# Patient Record
Sex: Male | Born: 1990 | Race: White | Hispanic: No | Marital: Single
Health system: Midwestern US, Community
[De-identification: ages and names within clinical notes are randomized; demographics above are authoritative.]

---

## 2011-06-27 ENCOUNTER — Emergency Department (INDEPENDENT_AMBULATORY_CARE_PROVIDER_SITE_OTHER)
Admission: EM | Admit: 2011-06-27 | Discharge: 2011-06-27 | Disposition: A | Discharge status: Home / Self Care | Payer: Self-pay | Source: Home / Self Care | Attending: Family Medicine | Admitting: Family Medicine

## 2011-06-27 ENCOUNTER — Encounter: Payer: Self-pay | Admitting: Emergency Medicine

## 2011-06-27 DIAGNOSIS — R05 Cough: Secondary | ICD-10-CM

## 2011-06-27 DIAGNOSIS — J40 Bronchitis, not specified as acute or chronic: Secondary | ICD-10-CM

## 2011-06-27 DIAGNOSIS — R059 Cough, unspecified: Secondary | ICD-10-CM

## 2011-06-27 MED ORDER — GUAIFENESIN-CODEINE 100-10 MG/5ML PO SYRP: 5.0000 mL | ORAL_SOLUTION | Freq: Four times a day (QID) | ORAL | Status: AC | PRN

## 2011-06-27 MED ORDER — ALBUTEROL SULFATE HFA 108 (90 BASE) MCG/ACT IN AERS
INHALATION_SPRAY | RESPIRATORY_TRACT | Status: AC
  Filled 2011-06-27: qty 6.7

## 2011-06-27 MED ORDER — AZITHROMYCIN 250 MG PO TABS: 250.0000 mg | ORAL_TABLET | Freq: Every day | ORAL | Status: AC

## 2011-06-27 MED ORDER — ALBUTEROL SULFATE HFA 108 (90 BASE) MCG/ACT IN AERS
2.0000 | INHALATION_SPRAY | RESPIRATORY_TRACT | Status: DC | PRN
  Administered 2011-06-27: 2 via RESPIRATORY_TRACT

## 2011-06-27 NOTE — ED Provider Notes (Signed)
History     CSN: [id number] Arrival date & time: 06/27/2011 10:58 AM   First MD Initiated Contact with Patient 06/27/11 1040      Chief Complaint  Patient presents with  . Cough  . Bronchitis    (Consider location/radiation/quality/duration/timing/severity/associated sxs/prior treatment) HPI Comments: Leonard Snow presents for evaluation of 3 weeks of persistent non-productive cough. He denies any fever. He does have asthma but has not been wheezing though he is out of his inhaler as he just moved, started a new job, and does not have insurance.   Patient is a 20 y.o. male presenting with cough. The history is provided by the patient.  Cough This is a new problem. The current episode started more than 1 week ago. The cough is non-productive. There has been no fever. Associated symptoms include myalgias. Pertinent negatives include no chills, no rhinorrhea, no sore throat, no shortness of breath and no wheezing. He has tried nothing for the symptoms. He is not a smoker.    Past Medical History  Diagnosis Date  . Asthma     History reviewed. No pertinent past surgical history.  Family History  Problem Relation Age of Onset  . Asthma Father     History  Substance Use Topics  . Smoking status: Never Smoker   . Smokeless tobacco: Not on file  . Alcohol Use: Yes      Review of Systems  Constitutional: Negative for fever and chills.  HENT: Negative for congestion, sore throat and rhinorrhea.   Eyes: Negative.   Respiratory: Positive for cough. Negative for shortness of breath and wheezing.   Gastrointestinal: Negative.   Genitourinary: Negative.   Musculoskeletal: Positive for myalgias.  Skin: Negative.   Neurological: Negative.     Allergies  Review of patient's allergies indicates no known allergies.  Home Medications   Current Outpatient Rx  Name Route Sig Dispense Refill  . AZITHROMYCIN 250 MG PO TABS Oral Take 1 tablet (250 mg total) by mouth daily. Take two  tablets on first day, then one tablet each day for four days 6 tablet 0  . GUAIFENESIN-CODEINE 100-10 MG/5ML PO SYRP Oral Take 5 mLs by mouth every 6 (six) hours as needed for cough or congestion. 120 mL 0    BP 121/70  Pulse 70  Temp(Src) 98.1 F (36.7 C) (Oral)  Resp 16  SpO2 99%  Physical Exam  Constitutional: He appears well-developed and well-nourished.  HENT:  Head: Normocephalic and atraumatic.  Right Ear: Tympanic membrane and external ear normal.  Left Ear: Tympanic membrane and external ear normal.  Mouth/Throat: Uvula is midline, oropharynx is clear and moist and mucous membranes are normal. No oropharyngeal exudate, posterior oropharyngeal edema or posterior oropharyngeal erythema.  Eyes: Conjunctivae and EOM are normal. Pupils are equal, round, and reactive to light.  Cardiovascular: Normal rate and regular rhythm.   Pulmonary/Chest: Effort normal and breath sounds normal. He has no wheezes. He has no rhonchi. He has no rales.  Skin: Skin is warm and dry.    ED Course  Procedures (including critical care time)  Labs Reviewed - No data to display No results found.   1. Cough   2. Bronchitis       MDM  Rx given: guaifenesin AC and Z-pack, with albuterol inhaler.        Richardo Priest, MD 06/27/11 [phone]

## 2011-06-27 NOTE — ED Notes (Signed)
Pt here with coughing spells that has worsened with yellow/blood tinged mucous,chest congestion and feeling like throat closing in on him at times.pt has hx asthma but not used inhaler.no wheezing or fevers reported.sats 100% r/a

## 2011-12-12 ENCOUNTER — Encounter (HOSPITAL_COMMUNITY): Payer: Self-pay

## 2011-12-12 ENCOUNTER — Emergency Department (HOSPITAL_COMMUNITY)
Admission: EM | Admit: 2011-12-12 | Discharge: 2011-12-12 | Disposition: A | Discharge status: Home / Self Care | Payer: Self-pay | Attending: Emergency Medicine | Admitting: Emergency Medicine

## 2011-12-12 DIAGNOSIS — J45909 Unspecified asthma, uncomplicated: Secondary | ICD-10-CM | POA: Insufficient documentation

## 2011-12-12 DIAGNOSIS — W57XXXA Bitten or stung by nonvenomous insect and other nonvenomous arthropods, initial encounter: Secondary | ICD-10-CM

## 2011-12-12 DIAGNOSIS — S30860A Insect bite (nonvenomous) of lower back and pelvis, initial encounter: Secondary | ICD-10-CM | POA: Insufficient documentation

## 2011-12-12 NOTE — ED Notes (Signed)
Pt. Believes he was bitten by a spider on his lumbar area.  There are 2 spots on his lower back. They are size of a pin head and are red with dk centers Denies any pain , are itchy, no swelling noted.   Pt. Did kill a brown recluse in his closet

## 2011-12-12 NOTE — Discharge Instructions (Signed)
Apply hydrocortisone cream twice a day. Take Claritin once a day as needed for itching. Return if the spots are getting significantly bigger , if he starts running pus.a fever, or if they start draining pus.

## 2011-12-12 NOTE — ED Provider Notes (Signed)
History  This chart was scribed for Dione Booze, MD by Bennett Scrape. This patient was seen in room STRE1/STRE1 and the patient's care was started at 11:25AM.  CSN: [id number]  Arrival date & time 12/12/11  1037   First MD Initiated Contact with Patient 12/12/11 1125      Chief Complaint  Patient presents with  . Insect Bite    The history is provided by the patient. No language interpreter was used.    Leonard Snow is a 21 y.o. male with a h/o asthma who presents to the Emergency Department complaining of 3 weeks ago of sudden onset, gradually worsening, constant two itchy insect bites in his lumbar region. He states that they started off as a small red spot that has become the size of a pea. He believes that it was a brown recluse spider that bite him, because he killed one in his closet. He denies drainage from the areas. He denies fever and chills as associated symptoms. He is an alcohol user but denies smoking.   Past Medical History  Diagnosis Date  . Asthma     History reviewed. No pertinent past surgical history.  Family History  Problem Relation Age of Onset  . Asthma Father     History  Substance Use Topics  . Smoking status: Never Smoker   . Smokeless tobacco: Not on file  . Alcohol Use: Yes      Review of Systems  Constitutional: Negative for fever and chills.  Respiratory: Negative for shortness of breath.   Gastrointestinal: Negative for nausea and vomiting.  Skin: Positive for wound (2 insect bites on lumbar region).  Neurological: Negative for weakness.    Allergies  Review of patient's allergies indicates no known allergies.  Home Medications   Current Outpatient Rx  Name Route Sig Dispense Refill  . ALBUTEROL SULFATE HFA 108 (90 BASE) MCG/ACT IN AERS Inhalation Inhale 2 puffs into the lungs every 6 (six) hours as needed. For shortness of breath      Triage Vitals: BP 134/76  Pulse 67  Temp(Src) 97.9 F (36.6 C) (Oral)  Resp 18  SpO2  96%  Physical Exam  Nursing note and vitals reviewed. Constitutional: He is oriented to person, place, and time. He appears well-developed and well-nourished. No distress.  HENT:  Head: Normocephalic and atraumatic.  Eyes: Conjunctivae and EOM are normal.  Neck: Neck supple. No tracheal deviation present.  Cardiovascular: Normal rate.   Pulmonary/Chest: Effort normal. No respiratory distress.  Abdominal: He exhibits no distension.  Musculoskeletal: Normal range of motion.  Neurological: He is alert and oriented to person, place, and time.  Skin: Skin is warm and dry.       Two indurated areas on the lower back approximately 3 mm in size, no erythema, no fluctuance, no warmth  Psychiatric: He has a normal mood and affect. His behavior is normal.    ED Course  Procedures (including critical care time)  DIAGNOSTIC STUDIES: Oxygen Saturation is 96% on room air, adequate by my interpretation.    COORDINATION OF CARE: 11:31AM-Discussed discharge plan of using OTC hydrocortisone cream or claritin to reduce the itching with pt and pt agreed.     1. Insect bite       MDM  There are 2 areas which appear to be arthropod bites. There is no evidence of necrosis and no evidence of abscess formation. He is reassured that there is no need for further treatment at this point.  I personally performed the services described in this documentation, which was scribed in my presence. The recorded information has been reviewed and considered.      Dione Booze, MD 12/15/11 [phone]

## 2012-11-02 ENCOUNTER — Emergency Department (HOSPITAL_COMMUNITY): Payer: Self-pay

## 2012-11-02 ENCOUNTER — Telehealth (HOSPITAL_COMMUNITY): Payer: Self-pay | Admitting: Emergency Medicine

## 2012-11-02 ENCOUNTER — Emergency Department (HOSPITAL_COMMUNITY)
Admission: EM | Admit: 2012-11-02 | Discharge: 2012-11-02 | Disposition: A | Discharge status: Home / Self Care | Payer: Self-pay | Attending: Emergency Medicine | Admitting: Emergency Medicine

## 2012-11-02 ENCOUNTER — Encounter (HOSPITAL_COMMUNITY): Payer: Self-pay | Admitting: Emergency Medicine

## 2012-11-02 DIAGNOSIS — W268XXA Contact with other sharp object(s), not elsewhere classified, initial encounter: Secondary | ICD-10-CM | POA: Insufficient documentation

## 2012-11-02 DIAGNOSIS — S91331A Puncture wound without foreign body, right foot, initial encounter: Secondary | ICD-10-CM

## 2012-11-02 DIAGNOSIS — M795 Residual foreign body in soft tissue: Secondary | ICD-10-CM

## 2012-11-02 DIAGNOSIS — Y9289 Other specified places as the place of occurrence of the external cause: Secondary | ICD-10-CM | POA: Insufficient documentation

## 2012-11-02 DIAGNOSIS — Y9302 Activity, running: Secondary | ICD-10-CM | POA: Insufficient documentation

## 2012-11-02 DIAGNOSIS — Z23 Encounter for immunization: Secondary | ICD-10-CM | POA: Insufficient documentation

## 2012-11-02 DIAGNOSIS — S91109A Unspecified open wound of unspecified toe(s) without damage to nail, initial encounter: Secondary | ICD-10-CM | POA: Insufficient documentation

## 2012-11-02 DIAGNOSIS — J45909 Unspecified asthma, uncomplicated: Secondary | ICD-10-CM | POA: Insufficient documentation

## 2012-11-02 DIAGNOSIS — Z79899 Other long term (current) drug therapy: Secondary | ICD-10-CM | POA: Insufficient documentation

## 2012-11-02 MED ORDER — HYDROCODONE-ACETAMINOPHEN 5-325 MG PO TABS: 1.0000 | ORAL_TABLET | Freq: Four times a day (QID) | ORAL | Status: DC | PRN

## 2012-11-02 MED ORDER — ONDANSETRON HCL 4 MG/2ML IJ SOLN
4.0000 mg | Freq: Once | INTRAMUSCULAR | Status: AC
  Administered 2012-11-02: 4 mg via INTRAVENOUS
  Filled 2012-11-02: qty 2

## 2012-11-02 MED ORDER — HYDROMORPHONE HCL PF 1 MG/ML IJ SOLN
1.0000 mg | Freq: Once | INTRAMUSCULAR | Status: AC
  Administered 2012-11-02: 1 mg via INTRAVENOUS
  Filled 2012-11-02: qty 1

## 2012-11-02 MED ORDER — TETANUS-DIPHTH-ACELL PERTUSSIS 5-2.5-18.5 LF-MCG/0.5 IM SUSP
0.5000 mL | Freq: Once | INTRAMUSCULAR | Status: AC
  Administered 2012-11-02: 0.5 mL via INTRAMUSCULAR
  Filled 2012-11-02: qty 0.5

## 2012-11-02 MED ORDER — CIPROFLOXACIN HCL 500 MG PO TABS: 500.0000 mg | ORAL_TABLET | Freq: Two times a day (BID) | ORAL | Status: DC

## 2012-11-02 NOTE — ED Notes (Signed)
BIB [facility]. Patient was jogging by scrap pile and contacted piece of lumber with nail attached. Last tetanus vaccine 06/2009. Fentanyl given by EMS. 18g Left forearm.

## 2012-11-02 NOTE — ED Provider Notes (Signed)
History     CSN: [id number]  Arrival date & time 11/02/12  1713   First MD Initiated Contact with Patient 11/02/12 1727      Chief Complaint  Patient presents with  . Foot Injury    (Consider location/radiation/quality/duration/timing/severity/associated sxs/prior treatment) Patient is a 22 y.o. male presenting with foot injury. The history is provided by the patient.  Foot Injury Location:  Toe Time since incident:  1 hour Injury: yes   Mechanism of injury comment:  Nail stuck in the right great toe Toe location:  R big toe Pain details:    Quality:  Sharp and throbbing   Radiates to:  Does not radiate   Severity:  Severe   Onset quality:  Sudden   Duration:  2 hours   Timing:  Constant   Progression:  Unchanged Chronicity:  New Tetanus status:  Up to date Prior injury to area:  No Relieved by:  Nothing Worsened by:  Nothing tried Ineffective treatments:  None tried Associated symptoms: no fever, no numbness and no tingling     Past Medical History  Diagnosis Date  . Asthma     History reviewed. No pertinent past surgical history.  Family History  Problem Relation Age of Onset  . Asthma Father     History  Substance Use Topics  . Smoking status: Never Smoker   . Smokeless tobacco: Not on file  . Alcohol Use: Yes      Review of Systems  Constitutional: Negative for fever.  Neurological: Negative for weakness and numbness.  All other systems reviewed and are negative.    Allergies  Review of patient's allergies indicates no known allergies.  Home Medications   Current Outpatient Rx  Name  Route  Sig  Dispense  Refill  . albuterol (PROVENTIL HFA;VENTOLIN HFA) 108 (90 BASE) MCG/ACT inhaler   Inhalation   Inhale 2 puffs into the lungs every 6 (six) hours as needed. For shortness of breath           BP 139/94  Pulse 88  Temp(Src) 98.5 F (36.9 C) (Oral)  Resp 18  SpO2 97%  Physical Exam  Nursing note and vitals  reviewed. Constitutional: He is oriented to person, place, and time. He appears well-developed and well-nourished. He appears distressed.  HENT:  Head: Normocephalic and atraumatic.  Eyes: EOM are normal. Pupils are equal, round, and reactive to light.  Cardiovascular: Normal rate.   Pulmonary/Chest: Effort normal.  Musculoskeletal: He exhibits tenderness.       Right foot: He exhibits normal capillary refill and no deformity.       Feet:  Neurological: He is alert and oriented to person, place, and time. He has normal strength. No sensory deficit.  Skin: Skin is warm and dry.  Psychiatric: He has a normal mood and affect. His behavior is normal. Thought content normal.    ED Course  Procedures (including critical care time)  Labs Reviewed - No data to display Dg Foot Complete Right  11/02/2012  *RADIOLOGY REPORT*  Clinical Data: Nail puncture wound of the great toe.  Remote prior ankle fracture.  RIGHT FOOT COMPLETE - 3+ VIEW  Comparison: None.  Findings: Linear calcific density noted along the lateral margin of the proximal metaphysis of the distal phalanx of the great toe, probably incidental.  No metal foreign body or fracture noted.  Transversely oriented corticated ossicle from the base of the fifth metatarsal, suspicious for nonunion of a avulsion fracture in the past, correlate with  patient history.  No malalignment at the Lisfranc joint observed.  IMPRESSION:  1.  No retained metal foreign body.  Faint linear calcification along the lateral proximal metaphysis of the distal phalanx of the great toe is probably incidental. 2.  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   Original Report Authenticated By: Gaylyn Rong, M.D.      No diagnosis found.    MDM   Patient with nail impaled in his right great toe.    Patient is complaining of pain in his right great toe but is neurovascularly intact. I did digital block however patient states did not  help much with the pain. Due to severe pain and radiology taking a long time to get an x-ray the nail was removed by pulling it out. Tetanus shot is up to date.   X-ray pending.  Will start on cipro for pseudomonas coverage due to going through tennis shoe.  7:12 PM X-ray without acute findings.  Pt states now that he does not think his tetanus is UTD so will update irrigate wound and d/c home.      Gwyneth Sprout, MD 11/02/12 [phone]

## 2012-11-02 NOTE — ED Notes (Signed)
Last rescue inhaler use >1 month

## 2012-11-19 ENCOUNTER — Emergency Department (INDEPENDENT_AMBULATORY_CARE_PROVIDER_SITE_OTHER)
Admission: EM | Admit: 2012-11-19 | Discharge: 2012-11-19 | Disposition: A | Discharge status: Home / Self Care | Payer: Self-pay | Source: Home / Self Care | Attending: Emergency Medicine | Admitting: Emergency Medicine

## 2012-11-19 ENCOUNTER — Encounter (HOSPITAL_COMMUNITY): Payer: Self-pay | Admitting: *Deleted

## 2012-11-19 DIAGNOSIS — K0520 Aggressive periodontitis, unspecified: Secondary | ICD-10-CM

## 2012-11-19 MED ORDER — PENICILLIN V POTASSIUM 500 MG PO TABS: 500.0000 mg | ORAL_TABLET | Freq: Three times a day (TID) | ORAL | Status: DC

## 2012-11-19 MED ORDER — HYDROCODONE-ACETAMINOPHEN 5-325 MG PO TABS: ORAL_TABLET | ORAL | Status: DC

## 2012-11-19 MED ORDER — NAPROXEN 500 MG PO TABS: 500.0000 mg | ORAL_TABLET | Freq: Two times a day (BID) | ORAL | Status: DC

## 2012-11-19 MED ORDER — METRONIDAZOLE 500 MG PO TABS: 500.0000 mg | ORAL_TABLET | Freq: Three times a day (TID) | ORAL | Status: DC

## 2012-11-19 NOTE — ED Provider Notes (Signed)
Chief Complaint:   Chief Complaint  Patient presents with  . Dental Pain    History of Present Illness:   Leonard Snow is a 22 year old male who has had a two-week history of pain in his bottom left wisdom tooth area which is erupting. The top left wisdom tooth came through about a month ago and cause similar pain but now is subsiding he's had a funny taste in his mouth and some swelling of the gingiva and the jaw externally. He denies any difficulty breathing or swallowing. It hurts to chew on that side. He denies fever, chills, headache, swelling of his neck, chest pain, or shortness of breath. This causes his entire lower left dentition to hurt.  Review of Systems:  Other than noted above, the patient denies any of the following symptoms: Systemic:  No fever, chills,  Or sweats. ENT:  No headache, ear ache, sore throat, nasal congestion, facial pain, or swelling. Lymphatic:  No adenopathy. Lungs:  No coughing, wheezing or shortness of breath.  PMFSH:  Past medical history, family history, social history, meds, and allergies were reviewed.  Physical Exam:   Vital signs:  BP 124/72  Pulse 72  Temp(Src) 98.6 F (37 C) (Oral)  Resp 16  SpO2 100% General:  Alert, oriented, in no distress. ENT:  TMs and canals normal.  Nasal mucosa normal. Mouth exam:  His teeth are in fairly good repair. The bottom left wisdom tooth is partially erupted. It's tender to touch and there is a small operculum of tissue overlying. There is no swelling, redness, or purulent drainage. No collection of pus. The pharynx is clear and airway is widely patent. No swelling of the floor the mouth. Neck:  No swelling or adenopathy. Lungs:  Breath sounds clear and equal bilaterally.  No wheezes, rales or rhonchi. Heart:  Regular rhythm.  No gallops or murmers. Skin:  Clear, warm and dry.   Assessment:  The encounter diagnosis was Acute pericoronitis.  Due to partially erupted wisdom tooth. This may, and through and the  problem may resolve on its own, if not he may need dental followup.  Plan:   1.  The following meds were prescribed:   New Prescriptions   HYDROCODONE-ACETAMINOPHEN (NORCO/VICODIN) 5-325 MG PER TABLET    1 to 2 tabs every 4 to 6 hours as needed for pain.   METRONIDAZOLE (FLAGYL) 500 MG TABLET    Take 1 tablet (500 mg total) by mouth 3 (three) times daily.   NAPROXEN (NAPROSYN) 500 MG TABLET    Take 1 tablet (500 mg total) by mouth 2 (two) times daily.   PENICILLIN V POTASSIUM (VEETID) 500 MG TABLET    Take 1 tablet (500 mg total) by mouth 3 (three) times daily.   2.  The patient was instructed in symptomatic care and handouts were given.  Suggested sleeping with head of bed elevated and hot salt water mouthwash. 3.  The patient was told to return if becoming worse in any way, if no better in 3 or 4 days, and given some red flag symptoms such as difficulty swallowing or breathing that would indicate earlier return, especially difficulty breathing. 4.  The patient was told to follow up with a dentist as soon as possible. He was given the name of Dr.Farless.    Leonard Likes, MD 11/19/12 1434

## 2012-11-19 NOTE — ED Notes (Signed)
Pt  States  He  Is  Having  Pain  From  His  Wisdom teeth    She          He  Reports  The  Symptoms  X  2  Weeks               He  Reports  Has  Not seen a  Dentist  Due  To  [employer]

## 2014-05-25 ENCOUNTER — Ambulatory Visit (INDEPENDENT_AMBULATORY_CARE_PROVIDER_SITE_OTHER): Payer: 59 | Admitting: Emergency Medicine

## 2014-05-25 VITALS — BP 118/82 | HR 68 | Temp 97.8°F | Resp 16 | Ht 72.0 in | Wt 205.0 lb

## 2014-05-25 DIAGNOSIS — Z0000 Encounter for general adult medical examination without abnormal findings: Secondary | ICD-10-CM

## 2014-05-25 NOTE — Progress Notes (Signed)
[facility] [address], [place] [address] [phone]  Date:  05/25/2014   Name:  Leonard [name]   DOB:  [birth year]   MRN:  [id number]  PCP:  No Pcp Per Pt    Chief Complaint: Annual Exam   History of Present Illness:  Leonard Snow is a 23 y.o. very pleasant male patient who presents with the following:  Wellness examination   There are no active problems to display for this patient.   Past Medical History  Diagnosis Date  . Asthma     History reviewed. No pertinent past surgical history.  History  Substance Use Topics  . Smoking status: Never Smoker   . Smokeless tobacco: Not on file  . Alcohol Use: Yes    Family History  Problem Relation Age of Onset  . Asthma Father     No Known Allergies  Medication list has been reviewed and updated.  No current outpatient prescriptions on file prior to visit.   No current facility-administered medications on file prior to visit.    Review of Systems:  As per HPI, otherwise negative.    Physical Examination: Filed Vitals:   05/25/14 1157  BP: 118/82  Pulse: 68  Temp: 97.8 F (36.6 C)  Resp: 16   Filed Vitals:   05/25/14 1157  Height: 6' (1.829 m)  Weight: 205 lb (92.987 kg)   Body mass index is 27.8 kg/(m^2). Ideal Body Weight: Weight in (lb) to have BMI = 25: 183.9  GEN: WDWN, NAD, Non-toxic, A & O x 3 HEENT: Atraumatic, Normocephalic. Neck supple. No masses, No LAD. Ears and Nose: No external deformity. CV: RRR, No M/G/R. No JVD. No thrill. No extra heart sounds. PULM: CTA B, no wheezes, crackles, rhonchi. No retractions. No resp. distress. No accessory muscle use. ABD: S, NT, ND, +BS. No rebound. No HSM. EXTR: No c/c/e NEURO Normal gait.  PSYCH: Normally interactive. Conversant. Not depressed or anxious appearing.  Calm demeanor.    Assessment and Plan: Wellness examination  Signed,  Phillips OdorJeffery Bernardine Langworthy, MD

## 2014-08-08 ENCOUNTER — Encounter (HOSPITAL_COMMUNITY): Payer: Self-pay | Admitting: *Deleted

## 2014-08-08 ENCOUNTER — Emergency Department (HOSPITAL_COMMUNITY)
Admission: EM | Admit: 2014-08-08 | Discharge: 2014-08-08 | Disposition: A | Discharge status: Home / Self Care | Payer: Self-pay | Attending: Emergency Medicine | Admitting: Emergency Medicine

## 2014-08-08 DIAGNOSIS — L03316 Cellulitis of umbilicus: Secondary | ICD-10-CM | POA: Insufficient documentation

## 2014-08-08 DIAGNOSIS — J45909 Unspecified asthma, uncomplicated: Secondary | ICD-10-CM | POA: Insufficient documentation

## 2014-08-08 LAB — CBC WITH DIFFERENTIAL/PLATELET
Basophils Absolute: 0.1 10*3/uL (ref 0.0–0.1)
Basophils Relative: 1 % (ref 0–1)
EOS ABS: 0.2 10*3/uL (ref 0.0–0.7)
EOS PCT: 3 % (ref 0–5)
HEMATOCRIT: 45.3 % (ref 39.0–52.0)
Hemoglobin: 15.7 g/dL (ref 13.0–17.0)
LYMPHS PCT: 23 % (ref 12–46)
Lymphs Abs: 2 10*3/uL (ref 0.7–4.0)
MCH: 31.2 pg (ref 26.0–34.0)
MCHC: 34.7 g/dL (ref 30.0–36.0)
MCV: 89.9 fL (ref 78.0–100.0)
Monocytes Absolute: 0.7 10*3/uL (ref 0.1–1.0)
Monocytes Relative: 8 % (ref 3–12)
NEUTROS PCT: 65 % (ref 43–77)
Neutro Abs: 5.7 10*3/uL (ref 1.7–7.7)
PLATELETS: 227 10*3/uL (ref 150–400)
RBC: 5.04 MIL/uL (ref 4.22–5.81)
RDW: 12.2 % (ref 11.5–15.5)
WBC: 8.6 10*3/uL (ref 4.0–10.5)

## 2014-08-08 LAB — COMPREHENSIVE METABOLIC PANEL
ALBUMIN: 4.2 g/dL (ref 3.5–5.2)
ALT: 14 U/L (ref 0–53)
AST: 20 U/L (ref 0–37)
Alkaline Phosphatase: 41 U/L (ref 39–117)
Anion gap: 7 (ref 5–15)
BILIRUBIN TOTAL: 0.9 mg/dL (ref 0.3–1.2)
BUN: 17 mg/dL (ref 6–23)
CALCIUM: 9.3 mg/dL (ref 8.4–10.5)
CHLORIDE: 104 mmol/L (ref 96–112)
CO2: 27 mmol/L (ref 19–32)
Creatinine, Ser: 1.01 mg/dL (ref 0.50–1.35)
GFR calc non Af Amer: >90 mL/min (ref >90)
GLUCOSE: 94 mg/dL (ref 70–99)
POTASSIUM: 3.9 mmol/L (ref 3.5–5.1)
Sodium: 138 mmol/L (ref 135–145)
Total Protein: 7.3 g/dL (ref 6.0–8.3)

## 2014-08-08 LAB — URINALYSIS, ROUTINE W REFLEX MICROSCOPIC
BILIRUBIN URINE: NEGATIVE
Glucose, UA: NEGATIVE mg/dL
Hgb urine dipstick: NEGATIVE
KETONES UR: NEGATIVE mg/dL
Leukocytes, UA: NEGATIVE
NITRITE: NEGATIVE
Protein, ur: NEGATIVE mg/dL
SPECIFIC GRAVITY, URINE: 1.019 (ref 1.005–1.030)
UROBILINOGEN UA: 0.2 mg/dL (ref 0.0–1.0)
pH: 6.5 (ref 5.0–8.0)

## 2014-08-08 MED ORDER — SULFAMETHOXAZOLE-TRIMETHOPRIM 800-160 MG PO TABS: 1.0000 | ORAL_TABLET | Freq: Two times a day (BID) | ORAL | Status: AC

## 2014-08-08 NOTE — ED Notes (Signed)
Pt reports having mid abd pain/pressure for several days, denies vomiting or diarrhea. Pt thinks possible umbilical hernia.

## 2014-08-08 NOTE — Discharge Instructions (Signed)
Please follow the directions provided.  Be sure to return for a re-check in 2 days.  Use warm, soapy water soaks frequently, along with warm compresses. Take your Bactrim as directed.  Don't hesitate to return for any new, worsening or concerning symptoms.     SEEK IMMEDIATE MEDICAL CARE IF:  You feel very sleepy.  You develop vomiting or diarrhea.  You have a general ill feeling (malaise) with muscle aches and pains.

## 2014-08-08 NOTE — ED Provider Notes (Signed)
CSN: [id number]     Arrival date & time 08/08/14  1433 History   First MD Initiated Contact with Patient 08/08/14 1754     Chief Complaint  Patient presents with  . Abdominal Pain   (Consider location/radiation/quality/duration/timing/severity/associated sxs/prior Treatment) HPI Leonard Snow is a 24 yo male presenting with report of pain in umbilicus x 1 week.  He states his 26 year old daughter was walking on his chest and abdomen 1 week ago and he felt some pain when she stepped on his umbilicus.  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 He noticed his belly button was tender to palpation and there was some swelling and drainage he noticed also.  He denies fever, chills, or nausea, vomiting.   Past Medical History  Diagnosis Date  . Asthma    History reviewed. No pertinent past surgical history. Family History  Problem Relation Age of Onset  . Asthma Father    History  Substance Use Topics  . Smoking status: Never Smoker   . Smokeless tobacco: Not on file  . Alcohol Use: Yes    Review of Systems  Constitutional: Negative for fever and chills.  HENT: Negative for sore throat.   Eyes: Negative for visual disturbance.  Respiratory: Negative for cough and shortness of breath.   Cardiovascular: Negative for chest pain and leg swelling.  Gastrointestinal: Negative for nausea, vomiting and diarrhea.  Genitourinary: Negative for dysuria.  Musculoskeletal: Negative for myalgias.  Skin: Positive for color change. Negative for rash.  Neurological: Negative for weakness, numbness and headaches.    Allergies  Review of patient's allergies indicates no known allergies.  Home Medications   Prior to Admission medications   Not on File   BP 133/63 mmHg  Pulse 64  Temp(Src) 98.6 F (37 C) (Oral)  Resp 18  SpO2 100% Physical Exam  Constitutional: He appears well-developed and well-nourished. No  distress.  HENT:  Head: Normocephalic and atraumatic.  Eyes: Conjunctivae are normal.  Neck: Neck supple. No thyromegaly present.  Cardiovascular: Normal rate, regular rhythm and intact distal pulses.   Pulmonary/Chest: Effort normal and breath sounds normal. No respiratory distress. He exhibits no tenderness.  Abdominal: Soft. There is no tenderness.  Musculoskeletal: He exhibits no tenderness.  Lymphadenopathy:    He has no cervical adenopathy.  Neurological: He is alert.  Skin: Skin is warm and dry. No rash noted. He is not diaphoretic. There is erythema.     Psychiatric: He has a normal mood and affect.  Nursing note and vitals reviewed.   ED Course  Procedures (including critical care time) Labs Review Labs Reviewed  CBC WITH DIFFERENTIAL/PLATELET  COMPREHENSIVE METABOLIC PANEL  URINALYSIS, ROUTINE W REFLEX MICROSCOPIC   Imaging Review No results found.   EKG Interpretation None      MDM   Final diagnoses:  Cellulitis of umbilicus   24 yo with redness and swelling within the umbilicus.  Pt reports pain is minimal. He denies any n/v/d. Disucssed case with Dr. Rosalia Hammersay. Will treat as celllulitis and discussed warm soaks and use of abx.  Instructed pt to return for wound re-check in 2 days.  Pt is well-appearing, afebrile and in no acute distress. Return precautions discussed and pt aware of plan and in agreement.   Filed Vitals:   08/08/14 1500 08/08/14 1827 08/08/14 2016  BP: 116/97 133/63 128/81  Pulse: 86 64 63  Temp: 98.6 F (  37 C)  98.9 F (37.2 C)  TempSrc: Oral  Oral  Resp: SpO2: 97% 100% 99%   Meds given in ED:  Medications - No data to display  There are no discharge medications for this patient.  08/08/14 0000  sulfamethoxazole-trimethoprim (BACTRIM DS,SEPTRA DS) 800-160 MG per tablet 2 times daily Discontinue Reprint 08/08/14 [address], NP 08/10/14 4098  Hilario Quarry, MD 08/19/14 1017

## 2014-08-17 ENCOUNTER — Emergency Department (HOSPITAL_COMMUNITY)
Admission: EM | Admit: 2014-08-17 | Discharge: 2014-08-17 | Disposition: A | Discharge status: Home / Self Care | Payer: Self-pay | Attending: Emergency Medicine | Admitting: Emergency Medicine

## 2014-08-17 ENCOUNTER — Encounter (HOSPITAL_COMMUNITY): Payer: Self-pay | Admitting: Family Medicine

## 2014-08-17 DIAGNOSIS — L0291 Cutaneous abscess, unspecified: Secondary | ICD-10-CM

## 2014-08-17 DIAGNOSIS — R200 Anesthesia of skin: Secondary | ICD-10-CM | POA: Insufficient documentation

## 2014-08-17 DIAGNOSIS — L02211 Cutaneous abscess of abdominal wall: Secondary | ICD-10-CM | POA: Insufficient documentation

## 2014-08-17 DIAGNOSIS — J45909 Unspecified asthma, uncomplicated: Secondary | ICD-10-CM | POA: Insufficient documentation

## 2014-08-17 DIAGNOSIS — R109 Unspecified abdominal pain: Secondary | ICD-10-CM | POA: Insufficient documentation

## 2014-08-17 DIAGNOSIS — R202 Paresthesia of skin: Secondary | ICD-10-CM | POA: Insufficient documentation

## 2014-08-17 LAB — URINALYSIS, ROUTINE W REFLEX MICROSCOPIC
Bilirubin Urine: NEGATIVE
Glucose, UA: NEGATIVE mg/dL
HGB URINE DIPSTICK: NEGATIVE
Ketones, ur: NEGATIVE mg/dL
Leukocytes, UA: NEGATIVE
NITRITE: NEGATIVE
PH: 5 (ref 5.0–8.0)
Protein, ur: NEGATIVE mg/dL
SPECIFIC GRAVITY, URINE: 1.024 (ref 1.005–1.030)
Urobilinogen, UA: 0.2 mg/dL (ref 0.0–1.0)

## 2014-08-17 MED ORDER — LIDOCAINE-EPINEPHRINE (PF) 2 %-1:200000 IJ SOLN
10.0000 mL | Freq: Once | INTRAMUSCULAR | Status: AC
  Administered 2014-08-17: 10 mL via INTRADERMAL
  Filled 2014-08-17: qty 20

## 2014-08-17 NOTE — ED Notes (Signed)
PA AT BEDSIDE I &D PT UMBILICUS

## 2014-08-17 NOTE — Discharge Instructions (Signed)
Keep the wound clean and dry, apply a warm compress and gentle pressure to allow more pus to drain. Call a neurologist for further evaluation of your generalized numbness. The results of your sexual transmitted infection test will take approximately 24-48 hours, if positive the hospital will contact you. Call for a follow up appointment with a Family or Primary Care Provider.  Return if Symptoms worsen.   Take medication as prescribed.   Emergency Department Resource Guide 1) Find a Doctor and Pay Out of Pocket Although you won't have to find out who is covered by your insurance plan, it is a good idea to ask around and get recommendations. You will then need to call the office and see if the doctor you have chosen will accept you as a new patient and what types of options they offer for patients who are self-pay. Some doctors offer discounts or will set up payment plans for their patients who do not have insurance, but you will need to ask so you aren't surprised when you get to your appointment.  2) Contact Your Local Health Department Not all health departments have doctors that can see patients for sick visits, but many do, so it is worth a call to see if yours does. If you don't know where your local health department is, you can check in your phone book. The CDC also has a tool to help you locate your state's health department, and many state websites also have listings of all of their local health departments.  3) Find a Walk-in Clinic If your illness is not likely to be very severe or complicated, you may want to try a walk in clinic. These are popping up all over the country in pharmacies, drugstores, and shopping centers. They're usually staffed by nurse practitioners or physician assistants that have been trained to treat common illnesses and complaints. They're usually fairly quick and inexpensive. However, if you have serious medical issues or chronic medical problems, these are probably  not your best option.  No Primary Care Doctor: - Call Health Connect at  [phone] - they can help you locate a primary care doctor that  accepts your insurance, provides certain services, etc. - Physician Referral Service- [phone]  Chronic Pain Problems: Organization         Address  Phone   Notes  [facility]  [phone] Patients need to be referred by their primary care doctor.   Medication Assistance: Organization         Address  Phone   Notes  [facility] [address] [place], [place] [address] [phone] --Must be a resident of [facility] [place] -- Must have NO insurance coverage whatsoever (no Medicaid/ Medicare, etc.) -- The pt. MUST have a primary care doctor that directs their care regularly and follows them in the community   MedAssist  [phone]   [employer]  [phone]    Agencies that provide inexpensive medical care: Organization         Address  Phone   Notes  [facility]  [phone]   [facility]    [phone]   [facility] [address] [place], [place] [address] [phone]   [facility] [address], [place] [phone]   [facility]anned [facility]    [phone]   [facility]    [phone]   [facility]  [address], [place] Phone:  [phone], Fax:  [phone] Hours of Operation:  9 am - 6 pm, M-F.  Also accepts Medicaid/Medicare and self-pay.  [facility]  [address], [place] Phone: [phone], Fax: [phone]. Hours of Operation:  8:30 am - 5:30 pm, M-F.  Also accepts Medicaid and self-pay.  [facility] [place] [address], [place] Phone: [phone]   [facility] [address] Natasha Bence [place], [place] [phone], Ext. [phone] Mondays & Thursdays: 7-9 AM.   First 15 patients are seen on a first come, first serve basis.    Medicaid-accepting [facility] Providers:  Organization         Address  Phone   Notes  [facility] [address], [place] [phone] Also accepts self-pay patients.  [facility] [address] Laurell Josephs [place], [place]  [phone]   [facility] [address], [place] [phone]   [facility] [address], [place] [phone]   Renaye Rakers [address], [place]   [phone] Only accepts [place] Access [place] patients after they have their name applied to their card.   Self-Pay (no insurance) in [facility]:  Organization         Address  Phone   Notes  Sickle Cell Patients, [facility] [address] [place], [place] [phone]   [facility] [address] [place], [place] [phone]   [facility]  [address], [place] [phone]   [facility]/Dr. Osei-Bonsu  [address], [place] or [address], [place] [phone] Phone number for both [place] and [place] locations is the same.  [facility] and [facility] [address] [place] [phone]   [facility] [address] [place] or [address] [phone]   [facility] [address] [place] [phone], phone; [phone], fax Sees patients 1st and 3rd Saturday of every month.  Must not qualify for public or private insurance (i.e. Medicaid, Medicare, [place] Health Choice, Veterans' Benefits)  Household income should be no more than 200% of the poverty level The clinic cannot treat you if you are pregnant or think you are pregnant  Sexually transmitted diseases are not treated at the clinic.    Dental  Care: Organization         Address  Phone  Notes  [facility] [address] [place], [place] [phone] Accepts children up to age 69 who are enrolled in [place] or [place] Health Choice; pregnant women with a Medicaid card; and children who have applied for Medicaid or [place] Health Choice, but were declined, whose parents can pay a reduced fee at time of service.  [facility] Department of [facility]  [address], [place] [phone] Accepts children up to age 59 who are enrolled in [place] or [place] Health Choice; pregnant women with a Medicaid card; and children who have applied for Medicaid or [place] Health Choice, but were declined, whose parents can pay a reduced fee at time of service.  [facility]  [phone]  Joellyn Quails, [place] [phone] Patients are seen by appointment only. Walk-ins are not accepted. [facility] will see patients 36 years of age and older. Monday - Tuesday (8am-5pm) Most Wednesdays (8:30-5pm) $30 per visit, cash only  [facility] Access [facility]  [address], [facility] [phone] Patients are seen by appointment only. Walk-ins are not accepted. [facility] will see patients 9 years of age and older. One Wednesday Evening (Monthly: Volunteer Based).  $30 per visit, cash only  [employer]  [phone] for adults; Children under age 78, call Graduate Pediatric Dentistry at [phone]. Children aged 85-14, please call [phone] to request a pediatric application.  Dental services are provided in all areas of dental care including fillings, crowns and bridges, complete and partial dentures, implants, gum treatment, root canals, and extractions. Preventive care is also provided. Treatment is provided to both adults and children. Patients are selected via a lottery and there is often a waiting list.   [facility] [address] [place]  [phone] [email or web address]   [facility] [address] [place], [place] [phone], Ext. [phone] Second and Fourth Thursday of each month, opens at 6:30 AM; Clinic ends at 9 AM.  Patients are seen on a first-come first-served basis, and a limited number are seen during each clinic.   [facility]  [address] Ether Griffins [place], [place] [phone]   Eligibility Requirements You must have lived in [place], [place], or [place] counties for at least the last three months.   You cannot be eligible for state or federal sponsored [place], including [employer], [place], or [employer].   You generally cannot be eligible for healthcare insurance through your employer.    How to apply: Eligibility screenings are held every Tuesday and Wednesday afternoon from 1:00 pm until 4:00 pm. You do not need an appointment for the interview!  [facility] [address], [place], [place] [phone]   [facility] Health Department  [phone]   [facility] Health Department  [phone]   [facility] Health Department  [phone]    Behavioral Health Resources in the Community: Intensive Outpatient Programs Organization         Address  Phone  Notes  [facility] Services [address] [place], [place] [phone]   [facility] [address], [place], [place] [phone]   [facility] [address] [place], [place]  [phone]   [facility] Mental Health [address],  [place], [place] [phone] or [phone]   Substance Abuse Resources Organization         Address  Phone  Notes  [facility] Drug Services  [phone]   [facility]  [phone]   [facility] [place]  [phone]   Floydene Flock  [phone]   Residential & Outpatient Substance Abuse Program  [phone]     Psychological Services Organization         Address  Phone  Notes  [facility] Behavioral Health  [phone]   [facility] Services  [phone]   [facility] Mental Health [address], [place] [phone] or [phone]    Mobile Crisis Teams Organization         Address  Phone  Notes  [facility], Mobile Crisis Care Unit  [phone]   [facility]  [address] [place], [place] [phone]  [facility] [address] [place] [phone]    Self-Help/Support Groups Organization         Address  Phone             Notes  [facility] [place] - variety of support groups  336- I7437963 Call for more information  Narcotics Anonymous (NA), [facility] [address], [employer] [place]  2 meetings at this location   Statistician         Address  Phone  Notes  [facility] [address],    [place]  [phone]   [facility]  [address] [place] [address], [place], [place] [phone]   [facility] [address] [place], [place] [phone] Admissions: 8am-3pm M-F  [facility] [address]    [place], [place] [phone]   [facility] [address] [place], [place], [place] [phone]   [facility] [address]  [place], [place] [phone]   [facility] - Intensive Outpatient [address] Laurell Josephs [address], [place], [place] [phone]   [facility] ([facility] [address] [place].,  [place], [place] [phone] or [phone]   [facility] ([facility]) [address], [place], [place] [phone] Accepts Medicaid  [facility] [place] [address],  [place] [phone] Substance Abuse/Addiction Treatment   [facility] Organization         Address  Phone  Notes  [facility]  [phone]   Angie Fava, PhD [address] Ervin Knack [place], [place]   [phone] or [phone]   [facility] Behavioral   [address] [place], [place] [phone]   [facility] [address], [place], [place] [phone] Insurance/Medicaid/sponsorship through [facility] [address]                                    [place], [place] [phone] Therapy/tele-psych/case  [facility] [address], [place] [phone]    Dr. Lolly Mustache  [phone]   [facility] [place]  [employer] [facility] Dept. 1) [address],  2) [address], [place] 3)  [address], [place] [phone]  [phone]   [facility] Child Abuse Hotline 208-284-1868 or [phone] (After Hours)

## 2014-08-17 NOTE — ED Notes (Signed)
Pt is stable upon d/c and ambulates from ED escorted by this RN. Pt verbalizes understanding rt d/c instructions.

## 2014-08-17 NOTE — ED Notes (Signed)
Pt states he has been having a feeling that is comparable to your leg feeling numb, but over the entire body.

## 2014-08-17 NOTE — ED Provider Notes (Signed)
CSN: [id number]     Arrival date & time 08/17/14  1354 History   First MD Initiated Contact with Patient 08/17/14 1552     Chief Complaint  Patient presents with  . Abscess     (Consider location/radiation/quality/duration/timing/severity/associated sxs/prior Treatment) HPI Comments: Patient is a 24 year old male presenting to the emergency room and chief complaint of recurrent umbilical infection. Patient reports she has recently seen for similar complaints. He reports persistent redness. He reports squeezing the lesion every several days with some discharge. Patient also complains of mild left abdominal discomfort ongoing for one month, no constipation or diarrhea. The patient also complains of full body paresthesias occurring every day lasting several minutes, self resolving. The patient denies headache, lightheadedness, dizziness, weakness, ataxia, difficulty. Patient also reports "pressure with urination" for 2 weeks. He denies penile discharge, genital lesions, lymphadenopathy, testicular pain or swelling, scrotal mass.    Patient is a 24 y.o. male presenting with abscess. The history is provided by the patient. No language interpreter was used.  Abscess Associated symptoms: no fever, no headaches, no nausea and no vomiting     Past Medical History  Diagnosis Date  . Asthma    History reviewed. No pertinent past surgical history. Family History  Problem Relation Age of Onset  . Asthma Father    History  Substance Use Topics  . Smoking status: Never Smoker   . Smokeless tobacco: Not on file  . Alcohol Use: Yes    Review of Systems  Constitutional: Negative for fever and chills.  Respiratory: Negative for shortness of breath.   Cardiovascular: Negative for chest pain and palpitations.  Gastrointestinal: Positive for abdominal pain. Negative for nausea, vomiting, diarrhea, constipation, blood in stool, abdominal distention, anal bleeding and rectal pain.  Genitourinary:  Negative for dysuria, urgency, frequency, hematuria, discharge, penile swelling, scrotal swelling, genital sores and penile pain.  Skin: Positive for color change.  Neurological: Positive for numbness. Negative for tremors, syncope, light-headedness and headaches.      Allergies  Review of patient's allergies indicates no known allergies.  Home Medications   Prior to Admission medications   Not on File   BP 121/59 mmHg  Pulse 51  Temp(Src) 98 F (36.7 C) (Oral)  Resp 16  Ht  (1.803 m)  Wt 180 lb (81.647 kg)  BMI 25.12 kg/m2  SpO2 100% Physical Exam  Constitutional: He is oriented to person, place, and time. He appears well-developed and well-nourished. No distress.  HENT:  Head: Normocephalic and atraumatic.  Neck: Neck supple.  Cardiovascular: Normal rate and regular rhythm.   Pulmonary/Chest: Effort normal. No respiratory distress. He has no wheezes. He has no rales.  Abdominal: Soft. There is no tenderness. There is no rebound and no guarding.  Erythemic umbilicus, mildly tender to palpation.  Purulent discharge with pressure. No surrounding erythema.  Genitourinary: Testes normal and penis normal. Right testis shows no tenderness. Left testis shows no tenderness. Uncircumcised. No penile tenderness. No discharge found.  Chaperone present.   Lymphadenopathy:       Right: No inguinal adenopathy present.       Left: No inguinal adenopathy present.  Neurological: He is alert and oriented to person, place, and time. He is not disoriented. He displays no atrophy and no tremor. No cranial nerve deficit or sensory deficit. He exhibits normal muscle tone. Gait normal. GCS eye subscore is 4. GCS verbal subscore is 5. GCS motor subscore is 6.  Speech is clear and goal oriented, follows commands II-Visual  fields were normal.   III/IV/VI-Pupils were equal and reacted. Extraocular movements were full and conjugate.  V/VII-no facial droop.   VIII-normal.   Motor: Strength 5/5  to upper and lower extremities bilaterally. Moves all 4 extremities equally. Sensory: normal sensation to upper and lower extremities.  Normal gait without assistance.  Skin: Skin is warm and dry.  Psychiatric: He has a normal mood and affect. His behavior is normal.  Nursing note and vitals reviewed.   ED Course  INCISION AND DRAINAGE Date/Time: 08/17/2014 5:59 PM Performed by: Mellody DrownPARKER, Kaleisha Bhargava Authorized by: Mellody DrownPARKER, Shawndell Schillaci Consent: Verbal consent obtained. Risks and benefits: risks, benefits and alternatives were discussed Consent given by: patient Patient understanding: patient states understanding of the procedure being performed Patient consent: the patient's understanding of the procedure matches consent given Procedure consent: procedure consent matches procedure scheduled Required items: required blood products, implants, devices, and special equipment available Patient identity confirmed: verbally with patient Time out: Immediately prior to procedure a "time out" was called to verify the correct patient, procedure, equipment, support staff and site/side marked as required. Type: abscess Body area: trunk Location details: abdomen Anesthesia: local infiltration Local anesthetic: lidocaine 2% with epinephrine Anesthetic total: 2 ml Patient sedated: no Scalpel size: 11 Needle gauge: 22 Incision type: single straight Complexity: simple Drainage: purulent and  serosanguinous Drainage amount: moderate Wound treatment: wound left open Patient tolerance: Patient tolerated the procedure well with no immediate complications   (including critical care time) Labs Review Labs Reviewed  URINALYSIS, ROUTINE W REFLEX MICROSCOPIC    Imaging Review No results found.   EKG Interpretation None      MDM   Final diagnoses:  Numbness and tingling  Abscess   Patient with multiple complaints. Recently seen for possible abscess in umbilicus. I and D performed with minimal purulent  drainage, wound left open. Patient is afebrile. Will not treat with antibiotics given no sign of cellulitis and localized infection. Discussed need for follow-up if umbilicus is persistently swollen due to possible lymphadenopathy, mary sister of joseph nodule. I was unable to reproduce left lower abdominal discomfort with palpation and do not feel that an emergent CT is indicated at this time. Patient also complains of daily numbness, no symptoms at this time, no neurologic deficits on exam moves all extremities well appearing no headache, history of cancers. Labs were performed at previous evaluation 08/08/2014, BMP and CMP without concerning abnormalities. Will not repeat these today. Advised patient follow-up with neurologist and PCP for his multiple complaints. Also concerned of pressure with urination, GC chlamydia probe performed, UA negative, patient declines treatment at this time will await results.  Resources given.  Discussed lab results, and treatment plan with the patient. Return precautions given. Reports understanding and no other concerns at this time.  Patient is stable for discharge at this time.     Mellody DrownLauren Anuj Summons, PA-C 08/17/14 2032  Toy BakerAnthony T Allen, MD 08/17/14 [phone]

## 2014-08-17 NOTE — ED Notes (Signed)
Per pt sts ingrown hair in abdomen area. sts he drained some. sts some pressure when voiding and flank pain. Denies blood in urine.

## 2014-08-18 LAB — GC/CHLAMYDIA PROBE AMP (~~LOC~~) NOT AT ARMC
CHLAMYDIA, DNA PROBE: NEGATIVE
NEISSERIA GONORRHEA: NEGATIVE

## 2014-08-22 ENCOUNTER — Emergency Department (HOSPITAL_COMMUNITY)
Admission: EM | Admit: 2014-08-22 | Discharge: 2014-08-22 | Disposition: A | Discharge status: Home / Self Care | Payer: Self-pay | Attending: Emergency Medicine | Admitting: Emergency Medicine

## 2014-08-22 ENCOUNTER — Encounter (HOSPITAL_COMMUNITY): Payer: Self-pay | Admitting: Emergency Medicine

## 2014-08-22 DIAGNOSIS — R202 Paresthesia of skin: Secondary | ICD-10-CM | POA: Insufficient documentation

## 2014-08-22 DIAGNOSIS — R5383 Other fatigue: Secondary | ICD-10-CM | POA: Insufficient documentation

## 2014-08-22 DIAGNOSIS — J45909 Unspecified asthma, uncomplicated: Secondary | ICD-10-CM | POA: Insufficient documentation

## 2014-08-22 DIAGNOSIS — R200 Anesthesia of skin: Secondary | ICD-10-CM | POA: Insufficient documentation

## 2014-08-22 DIAGNOSIS — Z8659 Personal history of other mental and behavioral disorders: Secondary | ICD-10-CM | POA: Insufficient documentation

## 2014-08-22 DIAGNOSIS — Z8619 Personal history of other infectious and parasitic diseases: Secondary | ICD-10-CM | POA: Insufficient documentation

## 2014-08-22 LAB — RAPID HIV SCREEN (WH-MAU): Rapid HIV Screen: NONREACTIVE

## 2014-08-22 NOTE — ED Provider Notes (Signed)
CSN: [id number]     Arrival date & time 08/22/14  1820 History   First MD Initiated Contact with Patient 08/22/14 2125     Chief Complaint  Patient presents with  . Fatigue  . Tingling     (Consider location/radiation/quality/duration/timing/severity/associated sxs/prior Treatment) HPI 24 year old male with past history of asthma, anxiety presents to ED complaining of generalized fatigue and generalized numbness and tingling which is been ongoing for the past couple of weeks intermittently. Patient states he is also concerned that he may have HIV as he had an unprotected sexual encounter back in November 2016. Patient states he was checked for gonorrhea and chlamydia last week when he was in [facility] ED but says he has been worried sick that he might also have HIV and would like to be tested at this time. Patient denies having any GU symptoms at this time. Patient states his fatigue and numbness and tingling will present all of a sudden usually while at work. He denies having any chest pain, shortness of breath, focal weakness, headache, vision changes, other symptoms. Past Medical History  Diagnosis Date  . Asthma    History reviewed. No pertinent past surgical history. Family History  Problem Relation Age of Onset  . Asthma Father    History  Substance Use Topics  . Smoking status: Never Smoker   . Smokeless tobacco: Not on file  . Alcohol Use: Yes    Review of Systems  Constitutional: Negative for fever, activity change and appetite change.  HENT: Negative for congestion, rhinorrhea and sore throat.   Eyes: Negative for visual disturbance.  Respiratory: Negative for cough and shortness of breath.   Cardiovascular: Negative for chest pain, palpitations and leg swelling.  Gastrointestinal: Negative for nausea, vomiting, abdominal pain and diarrhea.  Genitourinary: Negative for dysuria, flank pain, decreased urine volume and difficulty urinating.  Musculoskeletal: Negative for back  pain and neck pain.  Skin: Negative for rash.  Neurological: Positive for numbness (& tingling). Negative for dizziness, syncope, speech difficulty, weakness, light-headedness and headaches.  Psychiatric/Behavioral: Negative for confusion.      Allergies  Review of patient's allergies indicates no known allergies.  Home Medications   Prior to Admission medications   Not on File   BP 146/65 mmHg  Pulse 70  Temp(Src) 98.4 F (36.9 C) (Oral)  Resp 20  Ht  (1.803 m)  Wt 185 lb (83.915 kg)  BMI 25.81 kg/m2  SpO2 100% Physical Exam  Constitutional: He is oriented to person, place, and time. He appears well-developed and well-nourished. No distress.  HENT:  Head: Normocephalic and atraumatic.  Nose: Nose normal.  Mouth/Throat: Oropharynx is clear and moist. No oropharyngeal exudate.  Eyes: Conjunctivae and EOM are normal.  Neck: Normal range of motion. Neck supple. No JVD present.  Cardiovascular: Normal rate, regular rhythm, normal heart sounds and intact distal pulses.   Pulmonary/Chest: Effort normal and breath sounds normal. No respiratory distress.  Abdominal: Soft. He exhibits no distension. There is no tenderness. There is no rebound and no guarding.  Musculoskeletal: Normal range of motion.  Neurological: He is alert and oriented to person, place, and time. No cranial nerve deficit.  HDS, AAOx4. PERRL, EOMI, TML, face sym. CN 2-12 grossly intact. 5/5 sym, no drift, SILT, normal gait and coordination.    Skin: Skin is warm and dry. No rash noted.  Psychiatric: He has a normal mood and affect.  Nursing note and vitals reviewed.   ED Course  Procedures (including critical care  time) Labs Review Labs Reviewed - No data to display  Imaging Review No results found.   EKG Interpretation None      MDM   Final diagnoses:  None   Leonard Snow is a 24 y.o. male with H&P as above. This is a well appearing male. HDS, NAD. Patient again presents with multiple  complaints. Concerned about possible HIV although has no GU symptoms currently. Patient also reports he is having intermitBeau Fannytent numbness and tingling, although none currently. Benign neuro exam. On review of records, patient presented with same complaint on 08/17/14. Patient is a benign neuro exam. Will get rapid HIV screen at this time. No indication for other workup at this time.  HIV test non-reactive.  Clinical Impression: 1. Numbness and tingling     Disposition: Discharge  Condition: Good  I have discussed the results, Dx and Tx plan with the pt(& family if present). He/she/they expressed understanding and agree(s) with the plan. Discharge instructions discussed at great length. Strict return precautions discussed and pt &/or family have verbalized understanding of the instructions. No further questions at time of discharge.    New Prescriptions   No medications on file    [facility]     [address] [place] [id number] [phone]  To establish primary care, call above  [facility] [address] [id number] [place] [id number] [phone]  If symptoms worsen   Pt seen in conjunction with Dr. Glynn OctaveStephen Rancour, MD  Ames DuraStephen Areona Homer, DO [facility] Emergency [facility] Resident - PGY-2    Ames DuraStephen Latrica Clowers, MD 08/22/14 2325  Glynn OctaveStephen Rancour, MD 08/23/14 [phone]

## 2014-08-22 NOTE — ED Notes (Signed)
Pt c/o tingling all over with hx of same and fatigue; pt requesting HIV test

## 2015-07-11 ENCOUNTER — Emergency Department (HOSPITAL_COMMUNITY)
Admission: EM | Admit: 2015-07-11 | Discharge: 2015-07-12 | Disposition: A | Discharge status: Home / Self Care | Payer: Self-pay | Attending: Emergency Medicine | Admitting: Emergency Medicine

## 2015-07-11 ENCOUNTER — Encounter (HOSPITAL_COMMUNITY): Payer: Self-pay | Admitting: Vascular Surgery

## 2015-07-11 DIAGNOSIS — R6883 Chills (without fever): Secondary | ICD-10-CM | POA: Insufficient documentation

## 2015-07-11 DIAGNOSIS — R61 Generalized hyperhidrosis: Secondary | ICD-10-CM | POA: Insufficient documentation

## 2015-07-11 DIAGNOSIS — R630 Anorexia: Secondary | ICD-10-CM | POA: Insufficient documentation

## 2015-07-11 DIAGNOSIS — R1032 Left lower quadrant pain: Secondary | ICD-10-CM | POA: Insufficient documentation

## 2015-07-11 DIAGNOSIS — R109 Unspecified abdominal pain: Secondary | ICD-10-CM

## 2015-07-11 DIAGNOSIS — R110 Nausea: Secondary | ICD-10-CM | POA: Insufficient documentation

## 2015-07-11 DIAGNOSIS — J45909 Unspecified asthma, uncomplicated: Secondary | ICD-10-CM | POA: Insufficient documentation

## 2015-07-11 LAB — URINALYSIS, ROUTINE W REFLEX MICROSCOPIC
Bilirubin Urine: NEGATIVE
GLUCOSE, UA: NEGATIVE mg/dL
Hgb urine dipstick: NEGATIVE
KETONES UR: NEGATIVE mg/dL
LEUKOCYTES UA: NEGATIVE
NITRITE: NEGATIVE
PH: 5 (ref 5.0–8.0)
PROTEIN: NEGATIVE mg/dL
Specific Gravity, Urine: 1.026 (ref 1.005–1.030)

## 2015-07-11 LAB — COMPREHENSIVE METABOLIC PANEL
ALBUMIN: 4.4 g/dL (ref 3.5–5.0)
ALK PHOS: 45 U/L (ref 38–126)
ALT: 15 U/L — ABNORMAL LOW (ref 17–63)
ANION GAP: 10 (ref 5–15)
AST: 20 U/L (ref 15–41)
BUN: 27 mg/dL — ABNORMAL HIGH (ref 6–20)
CALCIUM: 9.7 mg/dL (ref 8.9–10.3)
CO2: 25 mmol/L (ref 22–32)
Chloride: 104 mmol/L (ref 101–111)
Creatinine, Ser: 1.18 mg/dL (ref 0.61–1.24)
GFR calc non Af Amer: >60 mL/min (ref >60)
GLUCOSE: 84 mg/dL (ref 65–99)
POTASSIUM: 3.8 mmol/L (ref 3.5–5.1)
SODIUM: 139 mmol/L (ref 135–145)
TOTAL PROTEIN: 7.9 g/dL (ref 6.5–8.1)
Total Bilirubin: 0.9 mg/dL (ref 0.3–1.2)

## 2015-07-11 LAB — CBC WITH DIFFERENTIAL/PLATELET
BASOS ABS: 0.1 10*3/uL (ref 0.0–0.1)
BASOS PCT: 1 %
EOS ABS: 0.2 10*3/uL (ref 0.0–0.7)
Eosinophils Relative: 2 %
HCT: 44.1 % (ref 39.0–52.0)
HEMOGLOBIN: 15.2 g/dL (ref 13.0–17.0)
LYMPHS ABS: 2.3 10*3/uL (ref 0.7–4.0)
Lymphocytes Relative: 23 %
MCH: 31.1 pg (ref 26.0–34.0)
MCHC: 34.5 g/dL (ref 30.0–36.0)
MCV: 90.2 fL (ref 78.0–100.0)
MONO ABS: 0.8 10*3/uL (ref 0.1–1.0)
Monocytes Relative: 8 %
NEUTROS ABS: 6.7 10*3/uL (ref 1.7–7.7)
Neutrophils Relative %: 66 %
PLATELETS: 284 10*3/uL (ref 150–400)
RBC: 4.89 MIL/uL (ref 4.22–5.81)
RDW: 12.2 % (ref 11.5–15.5)
WBC: 10.1 10*3/uL (ref 4.0–10.5)

## 2015-07-11 LAB — LIPASE, BLOOD: Lipase: 181 U/L — ABNORMAL HIGH (ref 11–51)

## 2015-07-11 MED ORDER — OXYCODONE-ACETAMINOPHEN 5-325 MG PO TABS: 1.0000 | ORAL_TABLET | ORAL | Status: DC | PRN

## 2015-07-11 MED ORDER — ONDANSETRON 4 MG PO TBDP: 8.0000 mg | ORAL_TABLET | Freq: Three times a day (TID) | ORAL | Status: DC | PRN

## 2015-07-11 NOTE — ED Provider Notes (Signed)
CSN: [id number]     Arrival date & time 07/11/15  1933 History  By signing my name below, I, Bethel Born, attest that this documentation has been prepared under the direction and in the presence of Azalia Bilis, MD. Electronically Signed: Bethel Born, ED Scribe. 07/11/2015. 11:53 PM      Chief Complaint  Patient presents with  . Abdominal Pain     The history is provided by the patient. No language interpreter was used.   Leonard Snow is a 24 y.o. male who presents to the Emergency Department complaining of intermittent, dull, waxing and waning, left lower abdominal pain with onset 1 week ago. The pain radiates to the groin and down the left leg. He had unprotected oral sex prior to the onset of pain. Associated symptoms include chills, sweating, decreased appetite over the last 2 days, and nausea. Pt denies fever, dysuria, penile discharge, and penile pain.   Past Medical History  Diagnosis Date  . Asthma    History reviewed. No pertinent past surgical history. Family History  Problem Relation Age of Onset  . Asthma Father    Social History  Substance Use Topics  . Smoking status: Never Smoker   . Smokeless tobacco: None  . Alcohol Use: Yes    Review of Systems 10 Systems reviewed and all are negative for acute change except as noted in the HPI.   Allergies  Review of patient's allergies indicates no known allergies.  Home Medications   Prior to Admission medications   Not on File   BP 124/72 mmHg  Pulse 75  Temp(Src) 98.2 F (36.8 C) (Oral)  Resp 16  Ht 6' (1.829 m)  Wt 189 lb (85.73 kg)  BMI 25.63 kg/m2  SpO2 100% Physical Exam  Constitutional: He is oriented to person, place, and time. He appears well-developed and well-nourished.  HENT:  Head: Normocephalic and atraumatic.  Eyes: EOM are normal.  Neck: Normal range of motion.  Cardiovascular: Normal rate, regular rhythm, normal heart sounds and intact distal pulses.   Pulmonary/Chest: Effort  normal and breath sounds normal. No respiratory distress.  Abdominal: Soft. He exhibits no distension. There is tenderness (mild) in the left lower quadrant. There is no rebound and no guarding.  Musculoskeletal: Normal range of motion.  Neurological: He is alert and oriented to person, place, and time.  Skin: Skin is warm and dry.  Psychiatric: He has a normal mood and affect. Judgment normal.  Nursing note and vitals reviewed.   ED Course  Procedures (including critical care time) DIAGNOSTIC STUDIES: Oxygen Saturation is 100% on RA,  normal by my interpretation.    COORDINATION OF CARE: 11:51 PM Discussed treatment plan which includes lab work and CT renal stone with pt at bedside and pt agreed to plan. Pt declines pain medication and antiemetic at the time of initial assessment.   Labs Review Labs Reviewed  LIPASE, BLOOD - Abnormal; Notable for the following:    Lipase 181 (*)    All other components within normal limits  COMPREHENSIVE METABOLIC PANEL - Abnormal; Notable for the following:    BUN 27 (*)    ALT 15 (*)    All other components within normal limits  URINALYSIS, ROUTINE W REFLEX MICROSCOPIC (NOT AT [facility]) - Abnormal; Notable for the following:    Color, Urine AMBER (*)    All other components within normal limits  CBC WITH DIFFERENTIAL/PLATELET    Imaging Review Ct Renal Stone Study  07/12/2015  CLINICAL DATA:  24 year old male with left lower quadrant abdominal pain. EXAM: CT ABDOMEN AND PELVIS WITHOUT CONTRAST TECHNIQUE: Multidetector CT imaging of the abdomen and pelvis was performed following the standard protocol without IV contrast. COMPARISON:  None. FINDINGS: Evaluation of this exam is limited in the absence of intravenous contrast. The visualized lung bases are clear. No intra-abdominal free air or free fluid. The liver, gallbladder, pancreas, spleen reason, adrenal glands, left kidney, left ureter appear unremarkable. There is apparent mild edema of the  right kidney with haziness of the renal sinus fat. Correlation with urinalysis recommended to exclude pyelonephritis. There is no hydronephrosis or nephrolithiasis on the right. The urinary bladder is only partially distended. There is apparent diffuse thickening of the bladder wall which may be partly related to underdistention. Cystitis is not excluded. Correlation with urinalysis recommended. The prostate and seminal vesicles are grossly unremarkable. There is moderate stool throughout the colon. No evidence of bowel obstruction or inflammation. Normal appendix. The abdominal aorta and IVC appear grossly unremarkable on this noncontrast study. No portal venous gas identified. There is no adenopathy. The abdominal wall soft tissues appear unremarkable. The osseous structures are intact. IMPRESSION: No hydronephrosis or nephrolithiasis. Mild haziness of the right renal sinus fat. Correlation with urinalysis recommended to exclude UTI. No evidence of bowel obstruction or inflammation.  Normal appendix. Electronically Signed   By: Elgie CollardArash  Radparvar M.D.   On: 07/12/2015 00:39   I have personally reviewed and evaluated these images and lab results as part of my medical decision-making.   EKG Interpretation None      MDM   Final diagnoses:  Nausea  Abdominal pain    2:02 AM CT scan without obvious abnormality to explain the patient's left lower quadrant abdominal pain.  No epigastric or right upper quadrant pain.  He does have mild elevation of his lipase to 181.  This could be playing a role as he could have mild pancreatitis however it would be abnormal given his pain down his left lower quadrant.  Patient be discharged home with nausea meds and anti-inflammatories.  I'll ask patient to follow-up with gastroenterology. if his symptoms complaining may benefit from sigmoidoscopy or colonoscopy.  I personally performed the services described in this documentation, which was scribed in my presence. The  recorded information has been reviewed and is accurate.       Azalia BilisKevin Blu Mcglaun, MD 07/12/15 [phone]

## 2015-07-12 ENCOUNTER — Emergency Department (HOSPITAL_COMMUNITY): Payer: Self-pay

## 2015-07-12 MED ORDER — ONDANSETRON 8 MG PO TBDP: 8.0000 mg | ORAL_TABLET | Freq: Three times a day (TID) | ORAL | Status: DC | PRN

## 2015-07-12 MED ORDER — IBUPROFEN 600 MG PO TABS: 600.0000 mg | ORAL_TABLET | Freq: Three times a day (TID) | ORAL | Status: DC | PRN

## 2015-07-12 NOTE — ED Notes (Signed)
Pt stable, ambulatory, states understanding of discharge instructions 

## 2015-07-12 NOTE — Discharge Instructions (Signed)

## 2015-07-13 ENCOUNTER — Emergency Department (HOSPITAL_COMMUNITY): Payer: Self-pay

## 2015-07-13 ENCOUNTER — Encounter (HOSPITAL_COMMUNITY): Payer: Self-pay | Admitting: Cardiology

## 2015-07-13 ENCOUNTER — Emergency Department (HOSPITAL_COMMUNITY)
Admission: EM | Admit: 2015-07-13 | Discharge: 2015-07-13 | Disposition: A | Discharge status: Home / Self Care | Payer: Self-pay | Attending: Emergency Medicine | Admitting: Emergency Medicine

## 2015-07-13 DIAGNOSIS — W19XXXA Unspecified fall, initial encounter: Secondary | ICD-10-CM

## 2015-07-13 DIAGNOSIS — J45909 Unspecified asthma, uncomplicated: Secondary | ICD-10-CM | POA: Insufficient documentation

## 2015-07-13 DIAGNOSIS — Y9389 Activity, other specified: Secondary | ICD-10-CM | POA: Insufficient documentation

## 2015-07-13 DIAGNOSIS — Y990 Civilian activity done for income or pay: Secondary | ICD-10-CM | POA: Insufficient documentation

## 2015-07-13 DIAGNOSIS — R109 Unspecified abdominal pain: Secondary | ICD-10-CM | POA: Insufficient documentation

## 2015-07-13 DIAGNOSIS — Y9289 Other specified places as the place of occurrence of the external cause: Secondary | ICD-10-CM | POA: Insufficient documentation

## 2015-07-13 DIAGNOSIS — Z113 Encounter for screening for infections with a predominantly sexual mode of transmission: Secondary | ICD-10-CM | POA: Insufficient documentation

## 2015-07-13 DIAGNOSIS — S0081XA Abrasion of other part of head, initial encounter: Secondary | ICD-10-CM | POA: Insufficient documentation

## 2015-07-13 DIAGNOSIS — W01198A Fall on same level from slipping, tripping and stumbling with subsequent striking against other object, initial encounter: Secondary | ICD-10-CM | POA: Insufficient documentation

## 2015-07-13 DIAGNOSIS — R42 Dizziness and giddiness: Secondary | ICD-10-CM | POA: Insufficient documentation

## 2015-07-13 DIAGNOSIS — R630 Anorexia: Secondary | ICD-10-CM | POA: Insufficient documentation

## 2015-07-13 DIAGNOSIS — R112 Nausea with vomiting, unspecified: Secondary | ICD-10-CM | POA: Insufficient documentation

## 2015-07-13 LAB — BASIC METABOLIC PANEL
ANION GAP: 9 (ref 5–15)
BUN: 22 mg/dL — ABNORMAL HIGH (ref 6–20)
CHLORIDE: 106 mmol/L (ref 101–111)
CO2: 24 mmol/L (ref 22–32)
CREATININE: 1.09 mg/dL (ref 0.61–1.24)
Calcium: 9.3 mg/dL (ref 8.9–10.3)
GFR calc non Af Amer: >60 mL/min (ref >60)
Glucose, Bld: 99 mg/dL (ref 65–99)
POTASSIUM: 4.6 mmol/L (ref 3.5–5.1)
SODIUM: 139 mmol/L (ref 135–145)

## 2015-07-13 LAB — HEPATIC FUNCTION PANEL
ALBUMIN: 4 g/dL (ref 3.5–5.0)
ALT: 15 U/L — AB (ref 17–63)
AST: 21 U/L (ref 15–41)
Alkaline Phosphatase: 44 U/L (ref 38–126)
BILIRUBIN INDIRECT: 1.1 mg/dL — AB (ref 0.3–0.9)
Bilirubin, Direct: 0.2 mg/dL (ref 0.1–0.5)
TOTAL PROTEIN: 6.8 g/dL (ref 6.5–8.1)
Total Bilirubin: 1.3 mg/dL — ABNORMAL HIGH (ref 0.3–1.2)

## 2015-07-13 LAB — CBC WITH DIFFERENTIAL/PLATELET
BASOS ABS: 0 10*3/uL (ref 0.0–0.1)
BASOS PCT: 0 %
EOS ABS: 0.1 10*3/uL (ref 0.0–0.7)
Eosinophils Relative: 1 %
HEMATOCRIT: 41.2 % (ref 39.0–52.0)
HEMOGLOBIN: 13.7 g/dL (ref 13.0–17.0)
Lymphocytes Relative: 13 %
Lymphs Abs: 1.5 10*3/uL (ref 0.7–4.0)
MCH: 30.3 pg (ref 26.0–34.0)
MCHC: 33.3 g/dL (ref 30.0–36.0)
MCV: 91.2 fL (ref 78.0–100.0)
Monocytes Absolute: 0.9 10*3/uL (ref 0.1–1.0)
Monocytes Relative: 8 %
NEUTROS ABS: 8.7 10*3/uL — AB (ref 1.7–7.7)
Neutrophils Relative %: 78 %
Platelets: 257 10*3/uL (ref 150–400)
RBC: 4.52 MIL/uL (ref 4.22–5.81)
RDW: 12.3 % (ref 11.5–15.5)
WBC: 11.3 10*3/uL — ABNORMAL HIGH (ref 4.0–10.5)

## 2015-07-13 LAB — URINALYSIS, ROUTINE W REFLEX MICROSCOPIC
Bilirubin Urine: NEGATIVE
GLUCOSE, UA: NEGATIVE mg/dL
Hgb urine dipstick: NEGATIVE
KETONES UR: 15 mg/dL — AB
LEUKOCYTES UA: NEGATIVE
Nitrite: NEGATIVE
PROTEIN: NEGATIVE mg/dL
Specific Gravity, Urine: 1.023 (ref 1.005–1.030)
pH: 7 (ref 5.0–8.0)

## 2015-07-13 LAB — LIPASE, BLOOD: LIPASE: 54 U/L — AB (ref 11–51)

## 2015-07-13 MED ORDER — SODIUM CHLORIDE 0.9 % IV BOLUS (SEPSIS)
1000.0000 mL | Freq: Once | INTRAVENOUS | Status: AC
Start: 2015-07-13 — End: 2015-07-13
  Administered 2015-07-13: 1000 mL via INTRAVENOUS

## 2015-07-13 NOTE — ED Notes (Signed)
PA at bedside.

## 2015-07-13 NOTE — ED Provider Notes (Signed)
CSN: [id number]     Arrival date & time 07/13/15  [id number] History   First MD Initiated Contact with Patient 07/13/15 1002     Chief Complaint  Patient presents with  . Abdominal Pain  . Emesis     (Consider location/radiation/quality/duration/timing/severity/associated sxs/prior Treatment) HPI   Patient is a 24 year old male with past medical history of asthma who presents to the ED with complaint of nausea, vomiting. Patient reports for the past few weeks he has been having lower abdominal pain with associated nausea and NBNB vomiting. He notes he was seen in the ED 2 days ago for his symptoms, labs and CT abdomen unremarkable and he was d/c home. Patient states since leaving the emergency department he has not been having any abdominal pain and has been feeling better. He notes when he was at work today he had sudden onset of nausea resulting in one episode of NBNB emesis. He states immediately after vomiting he began feeling lightheaded which resulted in him falling forward and hitting his head on the ground (dirt and grass). Denies LOC. Denies fever, chills, headache, visual changes, congestion, rhinorrhea, sore throat, cough, wheezing, SOB, CP, abdominal pain, diarrhea, urinary sxs, numbness, tingling, weakness. However, he notes he has continued to have a dec. Appetite. Pt also reports to have unprotected oral sex 1 week ago and he is concerned about having an STD. Denies dysuria, penile d/c, penile pain.   Past Medical History  Diagnosis Date  . Asthma    History reviewed. No pertinent past surgical history. Family History  Problem Relation Age of Onset  . Asthma Father    Social History  Substance Use Topics  . Smoking status: Never Smoker   . Smokeless tobacco: None  . Alcohol Use: Yes    Review of Systems  Constitutional: Positive for appetite change (dec).  Gastrointestinal: Positive for nausea and vomiting.  Neurological: Positive for light-headedness.      Allergies    Review of patient's allergies indicates no known allergies.  Home Medications   Prior to Admission medications   Not on File   BP 116/67 mmHg  Pulse 47  Temp(Src) 98.5 F (36.9 C) (Oral)  Resp 18  Ht 5\' 7"  (1.702 m)  Wt 85.73 kg  BMI 29.59 kg/m2  SpO2 100% Physical Exam  Constitutional: He is oriented to person, place, and time. He appears well-developed and well-nourished. No distress.  HENT:  Head: Normocephalic. Head is with abrasion. Head is without raccoon's eyes, without Battle's sign, without contusion and without laceration.    Right Ear: No hemotympanum.  Left Ear: No hemotympanum.  Nose: Nose normal. No nasal septal hematoma. Right sinus exhibits no maxillary sinus tenderness and no frontal sinus tenderness. Left sinus exhibits no maxillary sinus tenderness and no frontal sinus tenderness.  Mouth/Throat: Uvula is midline, oropharynx is clear and moist and mucous membranes are normal. No oropharyngeal exudate.  Eyes: Conjunctivae and EOM are normal. Pupils are equal, round, and reactive to light. Right eye exhibits no discharge. Left eye exhibits no discharge. No scleral icterus.  Neck: Normal range of motion. Neck supple.  Cardiovascular: Normal rate, regular rhythm, normal heart sounds and intact distal pulses.   Pulmonary/Chest: Effort normal and breath sounds normal. No respiratory distress. He has no wheezes. He has no rales. He exhibits no tenderness.  Abdominal: Soft. Bowel sounds are normal. He exhibits no distension and no mass. There is no tenderness. There is no rebound and no guarding.  Musculoskeletal: Normal range of motion.  He exhibits no edema or tenderness.  Lymphadenopathy:    He has no cervical adenopathy.  Neurological: He is alert and oriented to person, place, and time. He has normal strength. No cranial nerve deficit or sensory deficit. Coordination and gait normal.  Skin: Skin is warm and dry.  Nursing note and vitals reviewed.   ED Course   Procedures (including critical care time) Labs Review Labs Reviewed  URINALYSIS, ROUTINE W REFLEX MICROSCOPIC (NOT AT [facility]) - Abnormal; Notable for the following:    Ketones, ur 15 (*)    All other components within normal limits  CBC WITH DIFFERENTIAL/PLATELET - Abnormal; Notable for the following:    WBC 11.3 (*)    Neutro Abs 8.7 (*)    All other components within normal limits  BASIC METABOLIC PANEL - Abnormal; Notable for the following:    BUN 22 (*)    All other components within normal limits  LIPASE, BLOOD - Abnormal; Notable for the following:    Lipase 54 (*)    All other components within normal limits  HEPATIC FUNCTION PANEL - Abnormal; Notable for the following:    ALT 15 (*)    Total Bilirubin 1.3 (*)    Indirect Bilirubin 1.1 (*)    All other components within normal limits  HIV ANTIBODY (ROUTINE TESTING)  RPR  GC/CHLAMYDIA PROBE AMP ([place]) NOT AT [facility]    Imaging Review Ct Head Wo Contrast  07/13/2015  CLINICAL DATA:  24 year old male with recent history of lower abdominal pain and vomiting for the past week. Dizziness. Not eating or drinking well. EXAM: CT HEAD WITHOUT CONTRAST TECHNIQUE: Contiguous axial images were obtained from the base of the skull through the vertex without intravenous contrast. COMPARISON:  No priors. FINDINGS: No acute intracranial abnormalities. Specifically, no evidence of acute intracranial hemorrhage, no definite findings of acute/subacute cerebral ischemia, no mass, mass effect, hydrocephalus or abnormal intra or extra-axial fluid collections. Visualized paranasal sinuses and mastoids are well pneumatized. No acute displaced skull fractures are identified. IMPRESSION: *No acute intracranial abnormalities. *The appearance of the brain is normal. Electronically Signed   By: Trudie Reed M.D.   On: 07/13/2015 12:08   Ct Renal Stone Study  07/12/2015  CLINICAL DATA:  24 year old male with left lower quadrant abdominal pain. EXAM:  CT ABDOMEN AND PELVIS WITHOUT CONTRAST TECHNIQUE: Multidetector CT imaging of the abdomen and pelvis was performed following the standard protocol without IV contrast. COMPARISON:  None. FINDINGS: Evaluation of this exam is limited in the absence of intravenous contrast. The visualized lung bases are clear. No intra-abdominal free air or free fluid. The liver, gallbladder, pancreas, spleen reason, adrenal glands, left kidney, left ureter appear unremarkable. There is apparent mild edema of the right kidney with haziness of the renal sinus fat. Correlation with urinalysis recommended to exclude pyelonephritis. There is no hydronephrosis or nephrolithiasis on the right. The urinary bladder is only partially distended. There is apparent diffuse thickening of the bladder wall which may be partly related to underdistention. Cystitis is not excluded. Correlation with urinalysis recommended. The prostate and seminal vesicles are grossly unremarkable. There is moderate stool throughout the colon. No evidence of bowel obstruction or inflammation. Normal appendix. The abdominal aorta and IVC appear grossly unremarkable on this noncontrast study. No portal venous gas identified. There is no adenopathy. The abdominal wall soft tissues appear unremarkable. The osseous structures are intact. IMPRESSION: No hydronephrosis or nephrolithiasis. Mild haziness of the right renal sinus fat. Correlation with urinalysis recommended to  exclude UTI. No evidence of bowel obstruction or inflammation.  Normal appendix. Electronically Signed   By: Elgie Collard M.D.   On: 07/12/2015 00:39   I have personally reviewed and evaluated these images and lab results as part of my medical decision-making.   EKG Interpretation   Date/Time:  Thursday July 13 2015 11:06:53 EST Ventricular Rate:  49 PR Interval:  147 QRS Duration: 78 QT Interval:  424 QTC Calculation: 383 R Axis:   71 Text Interpretation:  Sinus bradycardia Atrial  premature complexes RSR' in  V1 or V2, probably normal variant ST elev, probable normal early repol  pattern No previous ECGs available Confirmed by Manus Gunning  MD, STEPHEN  [phone]) on 07/13/2015 11:12:12 AM      MDM   Final diagnoses:  Fall, initial encounter  Non-intractable vomiting with nausea, vomiting of unspecified type    Patient presents with episode of nausea and vomiting today that resulted in him becoming lightheaded and then sequentially falling forward and hitting his head on the ground. Denies LOC. Patient was seen in the ED 2 days ago for abdominal pain, nausea and vomiting, labs unremarkable, CT abdomen negative. He states his abdominal pain has resolved and notes he was feeling better up until this morning when he became nauseous. VSS. Exam revealed small abrasion noted to right frontal region, no active bleeding. Abdominal exam benign. No neuro deficits. No other signs of injury or trauma. Patient states he has not felt nauseous since arrival to the ED. Patient given IV fluids in the ED. Lipase 54 (dec. from 181 from labs done on 12/27). WBC 11.3. CT head revealed no acute abnormalities. I suspect patient's nausea vomiting are likely due to viral gastritis. Due to patient's abdominal pain resolving in his recent CT being negative I do not feel any further imaging is warranted at this time. Patient also reports concern for STD exposure with recent unprotected sex. Swab sent for gonorrhea chlamydia, HIV and RPR. Patient able tolerate by mouth. He states he still has his prescription for Zofran that was given to him during his prior visit to the ED 2 days ago. Discussed results and plan for discharge with patient. Patient given resource guide to follow up with PCP.  Evaluation does not show pathology requring ongoing emergent intervention or admission. Pt is hemodynamically stable and mentating appropriately. Discussed findings/results and plan with patient/guardian, who agrees with  plan. All questions answered. Return precautions discussed and outpatient follow up given.      Satira Sark [place], [place] 07/13/15 1318  Glynn Octave, MD 07/13/15 1750

## 2015-07-13 NOTE — ED Notes (Signed)
Patient transported to CT 

## 2015-07-13 NOTE — ED Notes (Signed)
Pt reports lower abd pain and vomiting that started about a week ago. States he was seen here a couple of days for the same with a negative work up. Also reports feeling dizzy, but not eating or drinking well.

## 2015-07-13 NOTE — Discharge Instructions (Signed)
You may take your prescription of Zofran as prescribed as needed for nausea. I recommend eating a bland diet for the next few days until you are feeling better. Please follow up with a primary care provider from the Resource Guide provided below in 4-5 days. Please return to the Emergency Department if symptoms worsen or new onset of fever, difficulty breathing, chest pain, abdominal pain, vomiting blood, diarrhea, blood in stool, syncope.   Emergency Department Resource Guide 1) Find a Doctor and Pay Out of Pocket Although you won't have to find out who is covered by your insurance plan, it is a good idea to ask around and get recommendations. You will then need to call the office and see if the doctor you have chosen will accept you as a new patient and what types of options they offer for patients who are self-pay. Some doctors offer discounts or will set up payment plans for their patients who do not have insurance, but you will need to ask so you aren't surprised when you get to your appointment.  2) Contact Your Local Health Department Not all health departments have doctors that can see patients for sick visits, but many do, so it is worth a call to see if yours does. If you don't know where your local health department is, you can check in your phone book. The CDC also has a tool to help you locate your state's health department, and many state websites also have listings of all of their local health departments.  3) Find a Walk-in Clinic If your illness is not likely to be very severe or complicated, you may want to try a walk in clinic. These are popping up all over the country in pharmacies, drugstores, and shopping centers. They're usually staffed by nurse practitioners or physician assistants that have been trained to treat common illnesses and complaints. They're usually fairly quick and inexpensive. However, if you have serious medical issues or chronic medical problems, these are  probably not your best option.  No Primary Care Doctor: - Call Health Connect at  [phone] - they can help you locate a primary care doctor that  accepts your insurance, provides certain services, etc. - Physician Referral Service- [phone]  Chronic Pain Problems: Organization         Address  Phone   Notes  [facility]  [phone] Patients need to be referred by their primary care doctor.   Medication Assistance: Organization         Address  Phone   Notes  [facility] Med[facility] [address] [place] [place], [place] [address] [phone] --Must be a resident of [facility] -- Must have NO insurance coverage whatsoever (no Medicaid/ Medicare, etc.) -- The pt. MUST have a primary care doctor that directs their care regularly and follows them in the community   [facility]  [phone]   [employer]  [phone]    Agencies that provide inexpensive medical care: Organization         Address  Phone   Notes  [facility]  [phone]   [facility]    [phone]   [facility] [address] [place], [place] [address] [phone]   [facility] [address] [place] [place] [phone]   Planned Parenthood    [phone]   [facility]    [phone]   Community  Health and [place]  [address], [place] Phone:  [phone], Fax:  [phone] Hours of Operation:  9 am - 6 pm, M-F.  Also accepts Medicaid/Medicare and self-pay.  [facility] for [address], [address], [place] Phone: [phone], Fax: [phone]. Hours of Operation:  8:30 am - 5:30 pm, M-F.  Also accepts Medicaid and self-pay.  [facility] [place] [address], [place] Phone: [phone]   [place], [place], [place] [phone], Ext. [phone] Mondays & Thursdays:  7-9 AM.  First 15 patients are seen on a first come, first serve basis.    [place] Providers:  Organization         Address  Phone   Notes  [facility] [address], [place] [phone] Also accepts self-pay patients.  [facility] [address], [place]  [phone]   [place], [address], [place] [phone]   [facility] [address], [place] [phone]   Lucianne Lei [address], [place]   [phone] Only accepts [place] Access [place] patients after they have their name applied to their card.   Self-Pay (no insurance) in [facility]:  Organization         Address  Phone   Notes  Sickle Cell Patients, [facility] [place] [phone]   [facility] [place] [phone]   [facility]  [place], [place], [place] [phone]   [facility]/Dr. Osei-Bonsu  [address] [place] or [address], [place] [phone] Phone number for both [place] and [place] locations is the same.  [facility] [address], [place] [phone]   [facility] [address] [place] or [address] [phone]   [facility] [address], [place] [phone], phone; [phone], fax Sees patients 1st and 3rd Saturday of every month.  Must not qualify for public or private insurance (i.e. Medicaid, Medicare, [place] Health Choice, Veterans' Benefits)  Household income should be no more than 200% of the poverty level The clinic cannot treat you if you are pregnant or think you are pregnant  Sexually transmitted diseases are not treated at the clinic.     Dental Care: Organization         Address  Phone  Notes  [facility] Department of [facility] [phone] Accepts children up to age 43 who are enrolled in [place] or [place]; pregnant women with a Medicaid card; and children who have applied for Medicaid or [place] Health Choice, but were declined, whose parents can pay a reduced fee at time of service.  [facility] Department of [facility]  [address], [place] [phone] Accepts children up to age 76 who are enrolled in [place] or [place]; pregnant women with a Medicaid card; and children who have applied for Medicaid or [place] Health Choice, but were declined, whose parents can pay a reduced fee at time of service.  [facility] Adult [facility] Access PROGRAM  [phone]  [place], [place] [phone] Patients are seen by appointment only. Walk-ins are not accepted. [place] will see patients 69 years of age and older. Monday - Tuesday (8am-5pm) Most Wednesdays (8:30-5pm) $30 per visit, cash only  [facility] Adult Dental Access PROGRAM  [address], [facility] [phone] Patients are seen by appointment only. Walk-ins are not accepted. [place] will see patients 73 years of age and older. One Wednesday Evening (Monthly: Volunteer Based).  $30 per visit, cash only  [place]  [phone] for adults; Children under age 72, call Graduate Pediatric Dentistry at [phone]. Children aged 82-14, please call [phone] to request a pediatric application.  Dental services are provided in all areas of dental care including fillings, crowns and bridges, complete and partial dentures, implants, gum treatment, root canals, and extractions. Preventive care is also provided. Treatment is provided to both adults and children. Patients are selected via a lottery and there is often a waiting  list.   [facility] [address] [place]  [phone] [email or web address]   [facility] [address] [place], [place] [phone], Ext. [phone] Second and Fourth Thursday of each month, opens at 6:30 AM; Clinic ends at 9 AM.  Patients are seen on a first-come first-served basis, and a limited number are seen during each clinic.   [facility]  [address] Hillard Danker [place], [place] [phone]   Eligibility Requirements You must have lived in [place], [place], or [place] counties for at least the last three months.   You cannot be eligible for state or federal sponsored [employer], including [employer], [place], or [employer].   You generally cannot be eligible for healthcare insurance through your employer.    How to apply: Eligibility screenings are held every Tuesday and Wednesday afternoon from 1:00 pm until 4:00 pm. You do not need an appointment for the interview!  [facility] [address], [place], [place]   [place]  [place] Department  [place]  [phone]    Behavioral Health Resources in the Community: Intensive Outpatient Programs Organization         Address  Phone  Notes  [employer] [place]. [address], [place], [place] [phone]   [facility] [address], [place], [place]   ADS: Alcohol & Drug Svcs [address], [place], [place]   [place] [address]  [place], [place] or [phone]   Substance Abuse Resources Organization         Address  Phone  Notes  Alcohol and Drug Services  [phone]   [facility]  [phone]   [facility]   Chinita Pester  [phone]   Residential & Outpatient Substance Abuse Program   6476280097   Psychological Services Organization         Address  Phone  Notes  [facility]  [place]  [phone]   [place] [address] [place] or [phone]    Mobile Crisis Teams Organization         Address  Phone  Notes  [facility], Mobile Crisis Care Unit  [phone]   [facility]  [address] [place], [place]   Ivin Booty  [place] [address] [place] [phone]    Self-Help/Support Groups Organization         Address  Phone             Notes  [facility] of [place] - variety of support groups  336- [phone] Call for more information  Narcotics Anonymous (NA), [facility] [address], [place]  2 meetings at this location   Statisticianesidential Treatment Programs Organization         Address  Phone  Notes  [facility] [address],    [place]  [phone]   [facility]  [address], [place] [phone], [place], [place] [phone]   [facility] [address], [place] [phone] Admissions: 8am-3pm M-F  [facility] [address]    [place], [place] [phone]   [facility] [address], [place], [place] [phone]   [facility] [address]  [place], [place] [phone]   [facility] - Intensive Outpatient [address], [place], [place] [phone]   [facility] ([facility] [address]  [place], [place] [phone] or [phone]   [facility] ([facility]) [address] [place], [place] [phone] Accepts Medicaid  [facility] [address]  [place] [phone] Substance Abuse/Addiction Treatment   [facility] Behavioral Health Resources Organization          Address  Phone  Notes  [facility]  [phone]) [phone]   Angie FavaJulie Brannon, PhD [address], [address] [place], [place]   [phone](336) [phone] or [phone](336) [phone]   [facility]   [address] [place], [place] [phone]) [phone]   [facility] [address], [place], [place] [phone]) [phone] Insurance/Medicaid/sponsorship through [facility]  [facility] [address]                                    [place], [place] [phone]) [phone] Therapy/tele-psych/case  [facility] [address].   [place], [place] [phone]) [phone]    Dr. Lolly MustacheArfeen  [phone]) [phone]   [facility]  [facility] 1) [address], [place] 2) [address], [place] 3)  [address], [place] [phone](336) [phone]  [phone](336) [phone]   [facility] Child Abuse Hotline [phone] 144-2695(336) 838-579-1229 or [phone](336) [phone] (After Hours)

## 2015-07-14 LAB — RPR: RPR Ser Ql: NONREACTIVE

## 2015-07-14 LAB — HIV ANTIBODY (ROUTINE TESTING W REFLEX): HIV Screen 4th Generation wRfx: NONREACTIVE

## 2015-07-15 LAB — GC/CHLAMYDIA PROBE AMP (~~LOC~~) NOT AT ARMC
CHLAMYDIA, DNA PROBE: NEGATIVE
NEISSERIA GONORRHEA: NEGATIVE

## 2015-07-26 ENCOUNTER — Emergency Department (HOSPITAL_COMMUNITY): Payer: Self-pay

## 2015-07-26 ENCOUNTER — Encounter (HOSPITAL_COMMUNITY): Payer: Self-pay | Admitting: Emergency Medicine

## 2015-07-26 ENCOUNTER — Emergency Department (HOSPITAL_COMMUNITY)
Admission: EM | Admit: 2015-07-26 | Discharge: 2015-07-26 | Disposition: A | Discharge status: Home / Self Care | Payer: Self-pay | Attending: Emergency Medicine | Admitting: Emergency Medicine

## 2015-07-26 DIAGNOSIS — R0989 Other specified symptoms and signs involving the circulatory and respiratory systems: Secondary | ICD-10-CM | POA: Insufficient documentation

## 2015-07-26 DIAGNOSIS — R070 Pain in throat: Secondary | ICD-10-CM | POA: Insufficient documentation

## 2015-07-26 DIAGNOSIS — R110 Nausea: Secondary | ICD-10-CM | POA: Insufficient documentation

## 2015-07-26 DIAGNOSIS — J45909 Unspecified asthma, uncomplicated: Secondary | ICD-10-CM | POA: Insufficient documentation

## 2015-07-26 NOTE — ED Notes (Signed)
PA at bedside.

## 2015-07-26 NOTE — ED Notes (Signed)
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 Pt states he has a very sharp pain in thoart and hurts to swallow. pts voice is clear and and states to diffculty breathing. No visible object in throat.

## 2015-07-26 NOTE — ED Notes (Signed)
Patient able to ambulate independently  

## 2015-07-26 NOTE — ED Provider Notes (Signed)
CSN: [id number]     Arrival date & time 07/26/15  1841 History  By signing my name below, I, Ronney Lion, attest that this documentation has been prepared under the direction and in the presence of [employer], PA-C. Electronically Signed: Ronney Lion, ED Scribe. 07/26/2015. 9:20 PM.    Chief Complaint  Patient presents with  . Swallowed Foreign Body   The history is provided by the patient. No language interpreter was used.    HPI Comments: Trevaun Rendleman is a 25 y.o. male with a history of asthma, who presents to the Emergency Department complaining of the sensation of a foreign body lodged in his throat, with onset PTA. 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 He then he felt something cause a sharp pain and get stuck in his throat. Patient states he coughed immediately when this happened. He complains of some associated nausea since. This is a new problem. Patient has not tried anything for his symptoms. He denies hemoptysis or dysphagia.   Past Medical History  Diagnosis Date  . Asthma    History reviewed. No pertinent past surgical history. Family History  Problem Relation Age of Onset  . Asthma Father    Social History  Substance Use Topics  . Smoking status: Never Smoker   . Smokeless tobacco: None  . Alcohol Use: Yes    Review of Systems A complete 10 system review of systems was obtained and all systems are negative except as noted in the HPI and PMH.    Allergies  Review of patient's allergies indicates no known allergies.  Home Medications   Prior to Admission medications   Not on File   BP 127/69 mmHg  Pulse 141  Temp(Src) 98.2 F (36.8 C) (Oral)  Resp 20  Ht 5\' 7"  (1.702 m)  Wt 85.73 kg  BMI 29.59 kg/m2  SpO2 95%   Physical Exam  Constitutional: He is oriented to person, place, and time. He appears well-developed and well-nourished. No distress.  HENT:  Head: Normocephalic and atraumatic.   Mouth/Throat: Uvula is midline, oropharynx is clear and moist and mucous membranes are normal. No oropharyngeal exudate, posterior oropharyngeal edema, posterior oropharyngeal erythema or tonsillar abscesses.  Eyes: Conjunctivae and EOM are normal.  Neck: Neck supple. No tracheal deviation present.  Cardiovascular: Normal rate, regular rhythm and normal heart sounds.  Exam reveals no gallop and no friction rub.   No murmur heard. Pulmonary/Chest: Effort normal and breath sounds normal. No respiratory distress. He has no wheezes. He has no rales.  Musculoskeletal: Normal range of motion.  Neurological: He is alert and oriented to person, place, and time.  Skin: Skin is warm and dry.  Psychiatric: He has a normal mood and affect. His behavior is normal.  Nursing note and vitals reviewed.   ED Course  Procedures (including critical care time)  DIAGNOSTIC STUDIES: Oxygen Saturation is 95% on RA, normal by my interpretation.    COORDINATION OF CARE: 8:14 PM - Pt made aware of negative CXR. Discussed treatment plan with pt at bedside which includes XR neck. Pt verbalized understanding and agreed to plan.   8:56 PM - Pt made aware of negative XR results. Given pt is having no respiratory issues at this time, I believe he can be discharged safely. Advised pt to follow up with ENT if he continues to have discomfort. Pt verbalized understanding and agreed to plan.    Imaging  Review Dg Neck Soft Tissue  07/26/2015  CLINICAL DATA:  Foreign body sensation in throat x 1 day EXAM: NECK SOFT TISSUES - 1+ VIEW COMPARISON:  None. FINDINGS: There is no evidence of retropharyngeal soft tissue swelling or epiglottic enlargement. The cervical airway is unremarkable and no radio-opaque foreign body identified. IMPRESSION: Negative. Electronically Signed   By: Norva PavlovElizabeth  Brown M.D.   On: 07/26/2015 20:42   Dg Chest 2 View  07/26/2015  CLINICAL DATA:  Larey SeatFell today. EXAM: CHEST  2 VIEW COMPARISON:  None.  FINDINGS: The heart size and mediastinal contours are within normal limits. Both lungs are clear. The visualized skeletal structures are unremarkable. IMPRESSION: Normal chest x-ray. Electronically Signed   By: Rudie MeyerP.  Gallerani M.D.   On: 07/26/2015 19:54   I have personally reviewed and evaluated these images and lab results as part of my medical decision-making.  MDM   Final diagnoses:  Foreign body sensation in throat   Labs:  Imaging: DG Chest 2 View, DG Neck Soft Tissue  Consults:  Therapeutics:  Discharge Meds:   Assessment/Plan:  Patient presents with initial complaints of possibly swallowing a foreign body after hammering on a roof at an upward angle at work. However, no foreign body is visualized or palpated on exam, and pt had a CXR and neck XR that were both negative for any acute findings. Given lack of respiratory concerns (patent airway and normal O2 saturation), I believe pt is safe for discharge home at this time. Advised pt to follow up with ENT for persistent or worsening symptoms as not all foreign bodies are radiopaque. Pt verbalized understanding and agreed to plan.     I personally performed the services described in this documentation, which was scribed in my presence. The recorded information has been reviewed and is accurate.     Eyvonne MechanicJeffrey Ivon Oelkers, PA-C 07/26/15 2120  Mancel BaleElliott [place], MD 07/27/15 [phone]

## 2015-07-26 NOTE — Discharge Instructions (Signed)
Please read attached information. If you experience any new or worsening signs or symptoms please return to the emergency room for evaluation. Please follow-up with your primary care provider or specialist as discussed. Please use medication prescribed only as directed and discontinue taking if you have any concerning signs or symptoms.   °

## 2016-11-03 ENCOUNTER — Inpatient Hospital Stay
Admit: 2016-11-03 | Discharge: 2016-11-03 | Disposition: A | Discharge status: Home / Self Care | Payer: Self-pay | Attending: Emergency Medicine

## 2016-11-03 DIAGNOSIS — R4182 Altered mental status, unspecified: Secondary | ICD-10-CM

## 2016-11-03 LAB — POC BLOOD GAS
BASE EXCESS: -2 mmol/L (ref <=3)
BICARBONATE: 22.9 mmol/L (ref 22.0–26.0)
CO2, TOTAL: 24 mmol/L (ref 21–32)
O2 SAT: 90 % — ABNORMAL HIGH (ref 70–75)
PCO2: 39.9 mm Hg — ABNORMAL LOW (ref 41.0–51.0)
PO2: 62 mm Hg — ABNORMAL HIGH (ref 35–40)
pH: 7.366 (ref 7.310–7.410)

## 2016-11-03 LAB — CBC WITH AUTOMATED DIFF
BASOPHILS: 0.8 % (ref 0–3)
EOSINOPHILS: 1.1 % (ref 0–5)
HCT: 37 % (ref 37.0–50.0)
HGB: 12.8 gm/dl (ref 12.4–17.2)
IMMATURE GRANULOCYTES: 0.5 % (ref 0.0–3.0)
LYMPHOCYTES: 19.1 % — ABNORMAL LOW (ref 28–48)
MCH: 31.4 pg (ref 23.0–34.6)
MCHC: 34.6 gm/dl (ref 30.0–36.0)
MCV: 90.9 fL (ref 80.0–98.0)
MONOCYTES: 7.8 % (ref 1–13)
MPV: 10.5 fL — ABNORMAL HIGH (ref 6.0–10.0)
NEUTROPHILS: 70.7 % — ABNORMAL HIGH (ref 34–64)
NRBC: 0 (ref 0–0)
PLATELET: 278 10*3/uL (ref 140–450)
RBC: 4.07 M/uL (ref 3.80–5.70)
RDW-SD: 40.4 (ref 35.1–43.9)
WBC: 13 10*3/uL — ABNORMAL HIGH (ref 4.0–11.0)

## 2016-11-03 LAB — METABOLIC PANEL, COMPREHENSIVE
ALT (SGPT): 31 U/L (ref 12–78)
AST (SGOT): 33 U/L (ref 15–37)
Albumin: 3.4 gm/dl (ref 3.4–5.0)
Alk. phosphatase: 43 U/L — ABNORMAL LOW (ref 45–117)
BUN: 29 mg/dl — ABNORMAL HIGH (ref 7–25)
Bilirubin, total: 0.4 mg/dl (ref 0.2–1.0)
CO2: 23 mEq/L (ref 21–32)
Calcium: 9 mg/dl (ref 8.5–10.1)
Chloride: 105 mEq/L (ref 98–107)
Creatinine: 1.4 mg/dl — ABNORMAL HIGH (ref 0.6–1.3)
GFR est AA: >60
GFR est non-AA: >60
Glucose: 83 mg/dl (ref 74–106)
Potassium: 4 mEq/L (ref 3.5–5.1)
Protein, total: 7.2 gm/dl (ref 6.4–8.2)
Sodium: 139 mEq/L (ref 136–145)

## 2016-11-03 LAB — ETHYL ALCOHOL: ALCOHOL(ETHYL),SERUM: 63 mg/dl — ABNORMAL HIGH (ref 0.0–9.0)

## 2016-11-03 MED ORDER — SODIUM CHLORIDE 0.9 % IJ SYRG
Freq: Once | INTRAMUSCULAR | Status: AC
Start: 2016-11-03 — End: 2016-11-03
  Administered 2016-11-03: 10:00:00 via INTRAVENOUS

## 2016-11-03 MED ORDER — SODIUM CHLORIDE 0.9% BOLUS IV
0.9 % | INTRAVENOUS | Status: AC
Start: 2016-11-03 — End: 2016-11-03
  Administered 2016-11-03: 08:00:00 via INTRAVENOUS

## 2016-11-03 MED ORDER — SODIUM CHLORIDE 0.9% BOLUS IV
0.9 % | INTRAVENOUS | Status: AC
Start: 2016-11-03 — End: 2016-11-03
  Administered 2016-11-03: 10:00:00 via INTRAVENOUS
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
## 2016-11-03 NOTE — ED Notes (Signed)
Patient falling asleep during assessment.  

## 2016-11-03 NOTE — ED Notes (Signed)
9:46 AM  11/03/16     Discharge instructions given to patient (name) with verbalization of understanding. Patient accompanied by patient.  Patient discharged with the following prescriptions   Current Discharge Medication List       . Patient discharged to Home (destination).      Dewitt Hoes, RN
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
## 2016-11-03 NOTE — ED Notes (Signed)
Pt up to restroom, ambulated with steady gait.
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
## 2016-11-03 NOTE — ED Notes (Signed)
Bedside shift change report given to Ken, RN (oncoming nurse) by Vicki, RN (offgoing nurse). Report included the following information SBAR.

## 2017-03-02 IMAGING — CT CT HEAD W/O CM
2 series · 15 of 30 positions shown, 17 images · non-contrast
Comparison: No priors.

CLINICAL DATA: 24-year-old male with recent history of lower
abdominal pain and vomiting for the past week. Dizziness. Not eating
or drinking well.

EXAM:
CT HEAD WITHOUT CONTRAST
TECHNIQUE: Contiguous axial images were obtained from the base of the skull
through the vertex without intravenous contrast.

[Series 2: head without · axial · non-contrast · 0.48mm/px · z∈[-83,+42]mm · 7 of 35 slices shown, 9 images]
[im 5/35  brain]
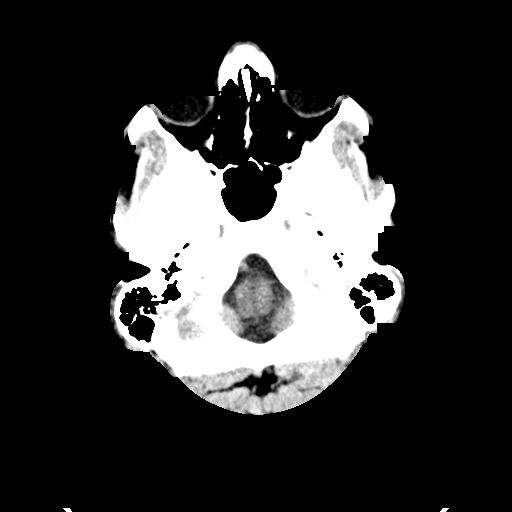
[im 5/35  bone]
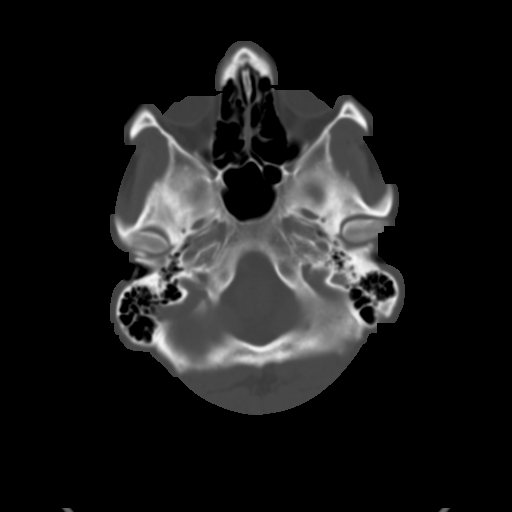
[im 9/35  brain]
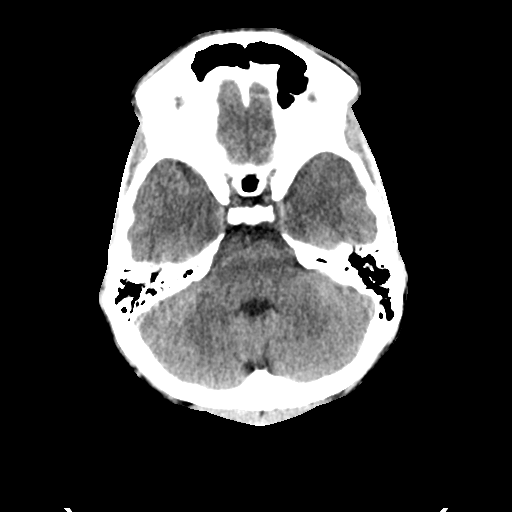
[im 13/35  brain]
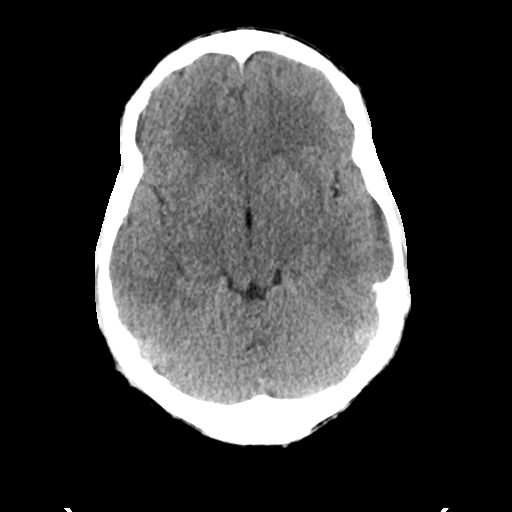
[im 18/35  brain]
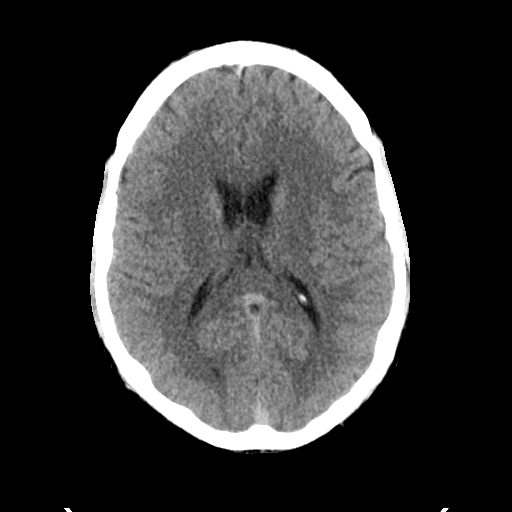
[im 22/35  brain]
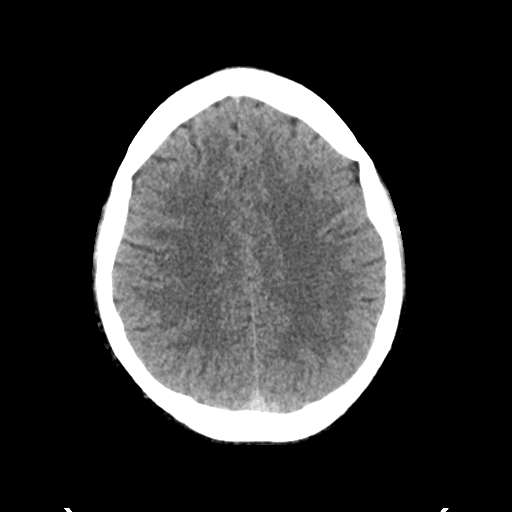
[im 22/35  bone]
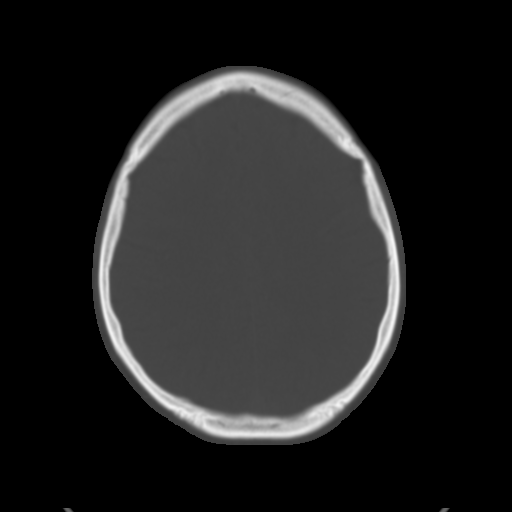
[im 26/35  brain]
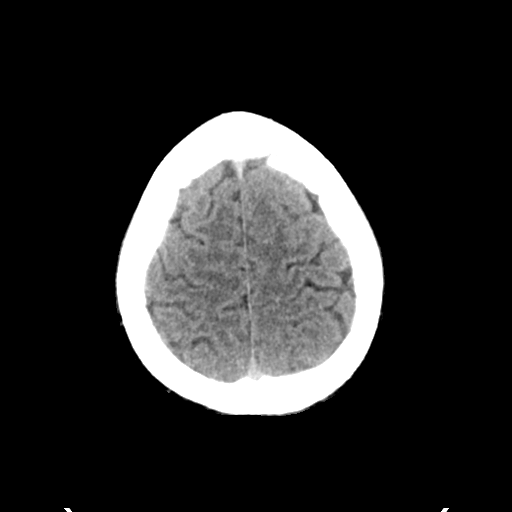
[im 30/35  brain]
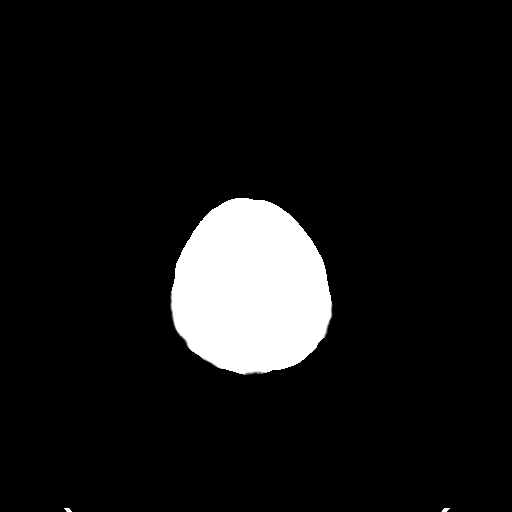

[Series 3: head bone · axial · 0.48mm/px · z∈[-87,+51]mm · 8 of 87 slices shown]
[im 9/87  bone]
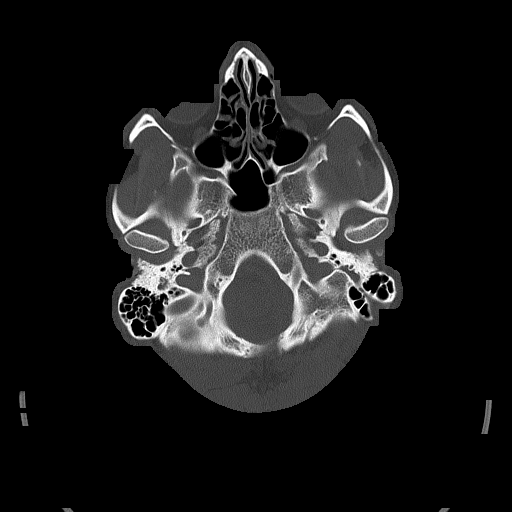
[im 18/87  bone]
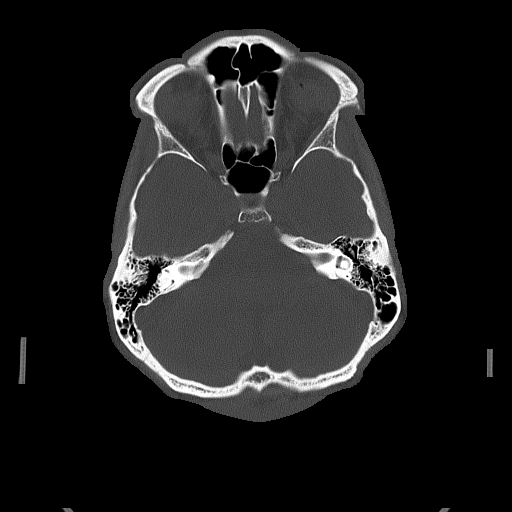
[im 26/87  bone]
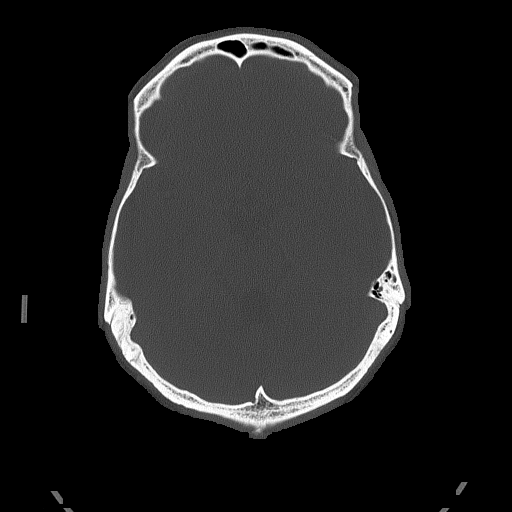
[im 39/87  bone]
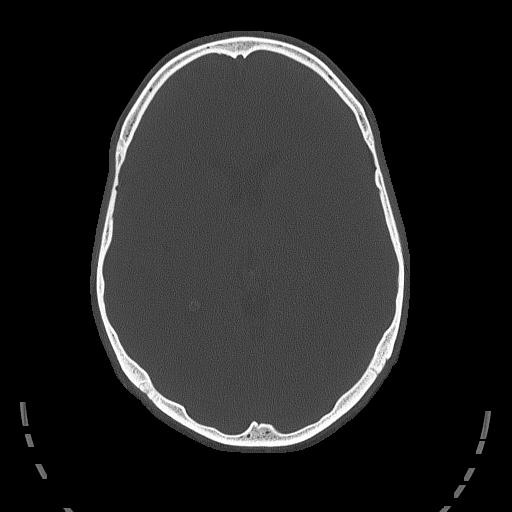
[im 48/87  bone]
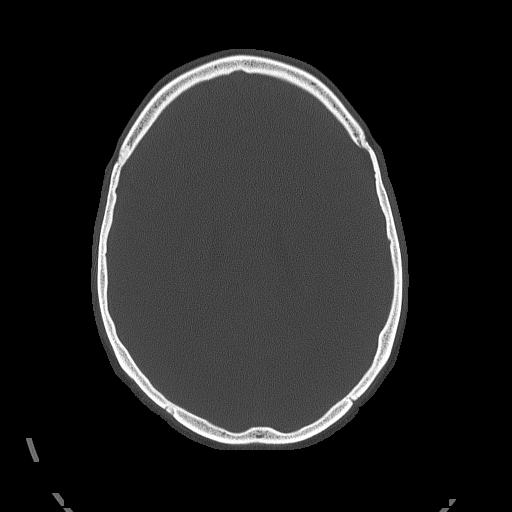
[im 61/87  bone]
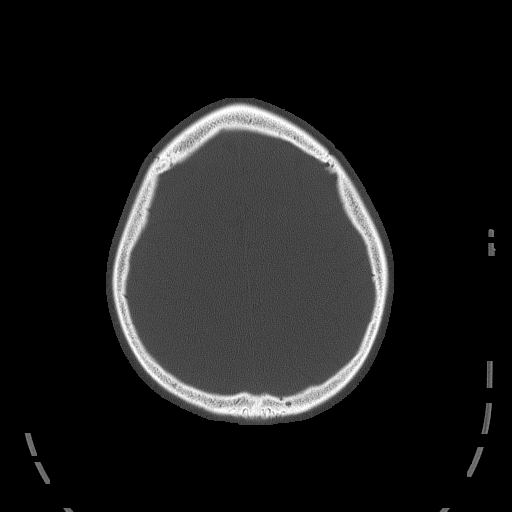
[im 69/87  bone]
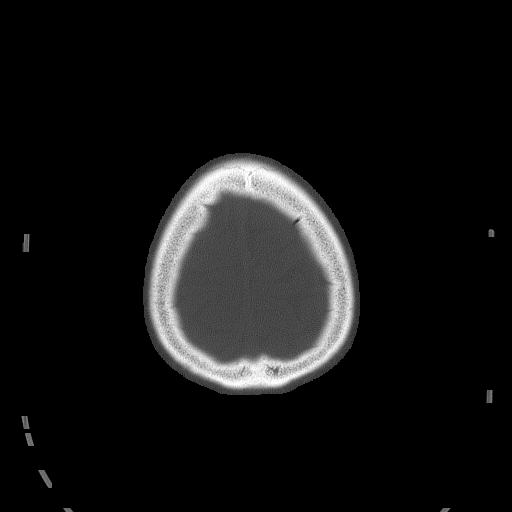
[im 78/87  bone]
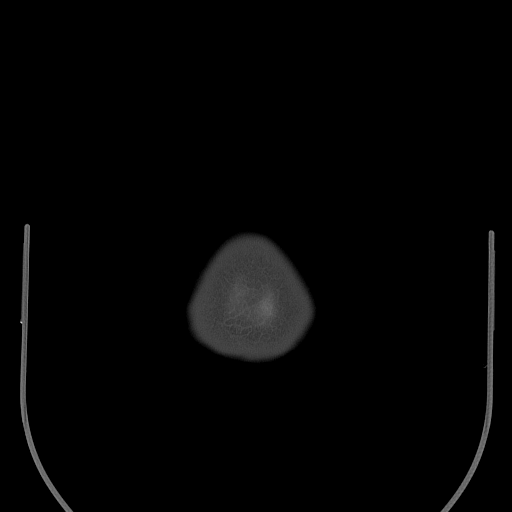

[15 of 30 positions shown; findings below may reference images not displayed]

FINDINGS: No acute intracranial abnormalities. Specifically, no evidence of
acute intracranial hemorrhage, no definite findings of
acute/subacute cerebral ischemia, no mass, mass effect,
hydrocephalus or abnormal intra or extra-axial fluid collections.
Visualized paranasal sinuses and mastoids are well pneumatized. No
acute displaced skull fractures are identified.
IMPRESSION: *No acute intracranial abnormalities.
*The appearance of the brain is normal.

## 2017-03-15 IMAGING — DX DG NECK SOFT TISSUE
2 series · 2 of 2 positions shown · non-contrast
Comparison: None.

CLINICAL DATA: Foreign body sensation in throat x 1 day

EXAM:
NECK SOFT TISSUES - 1+ VIEW

[neck lat]
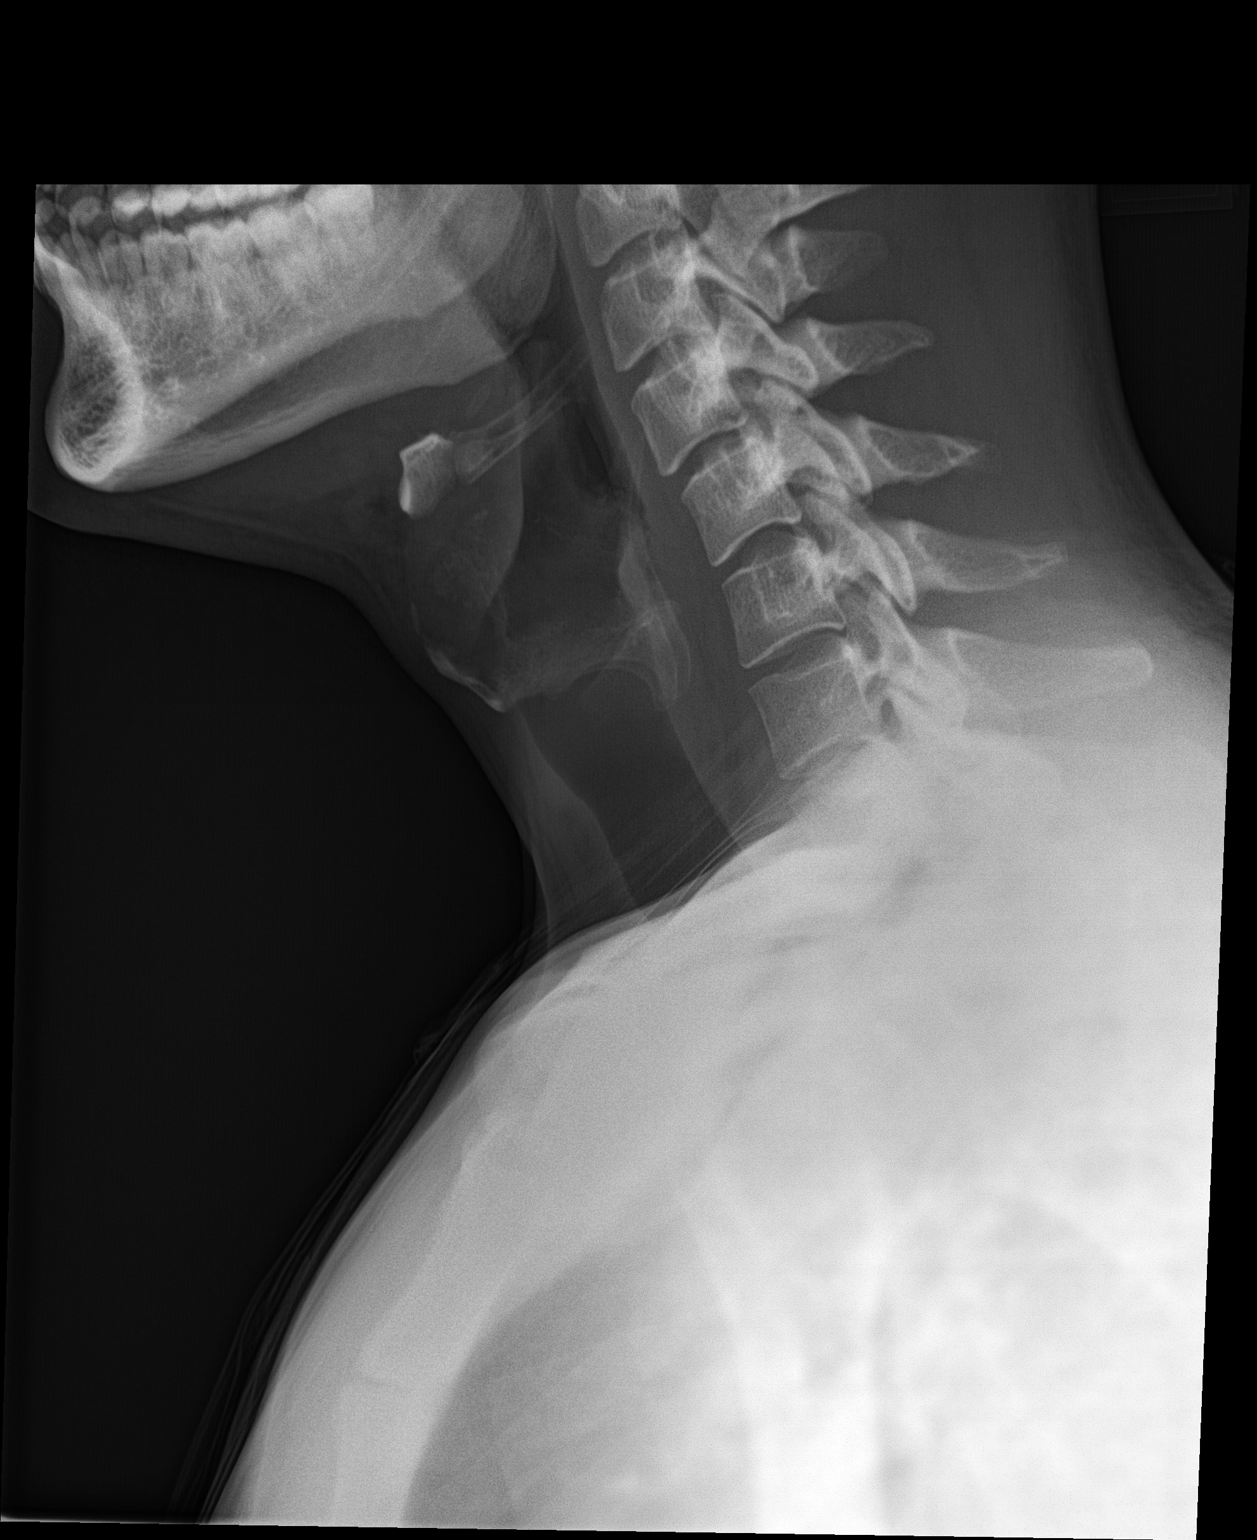

[neck ap]
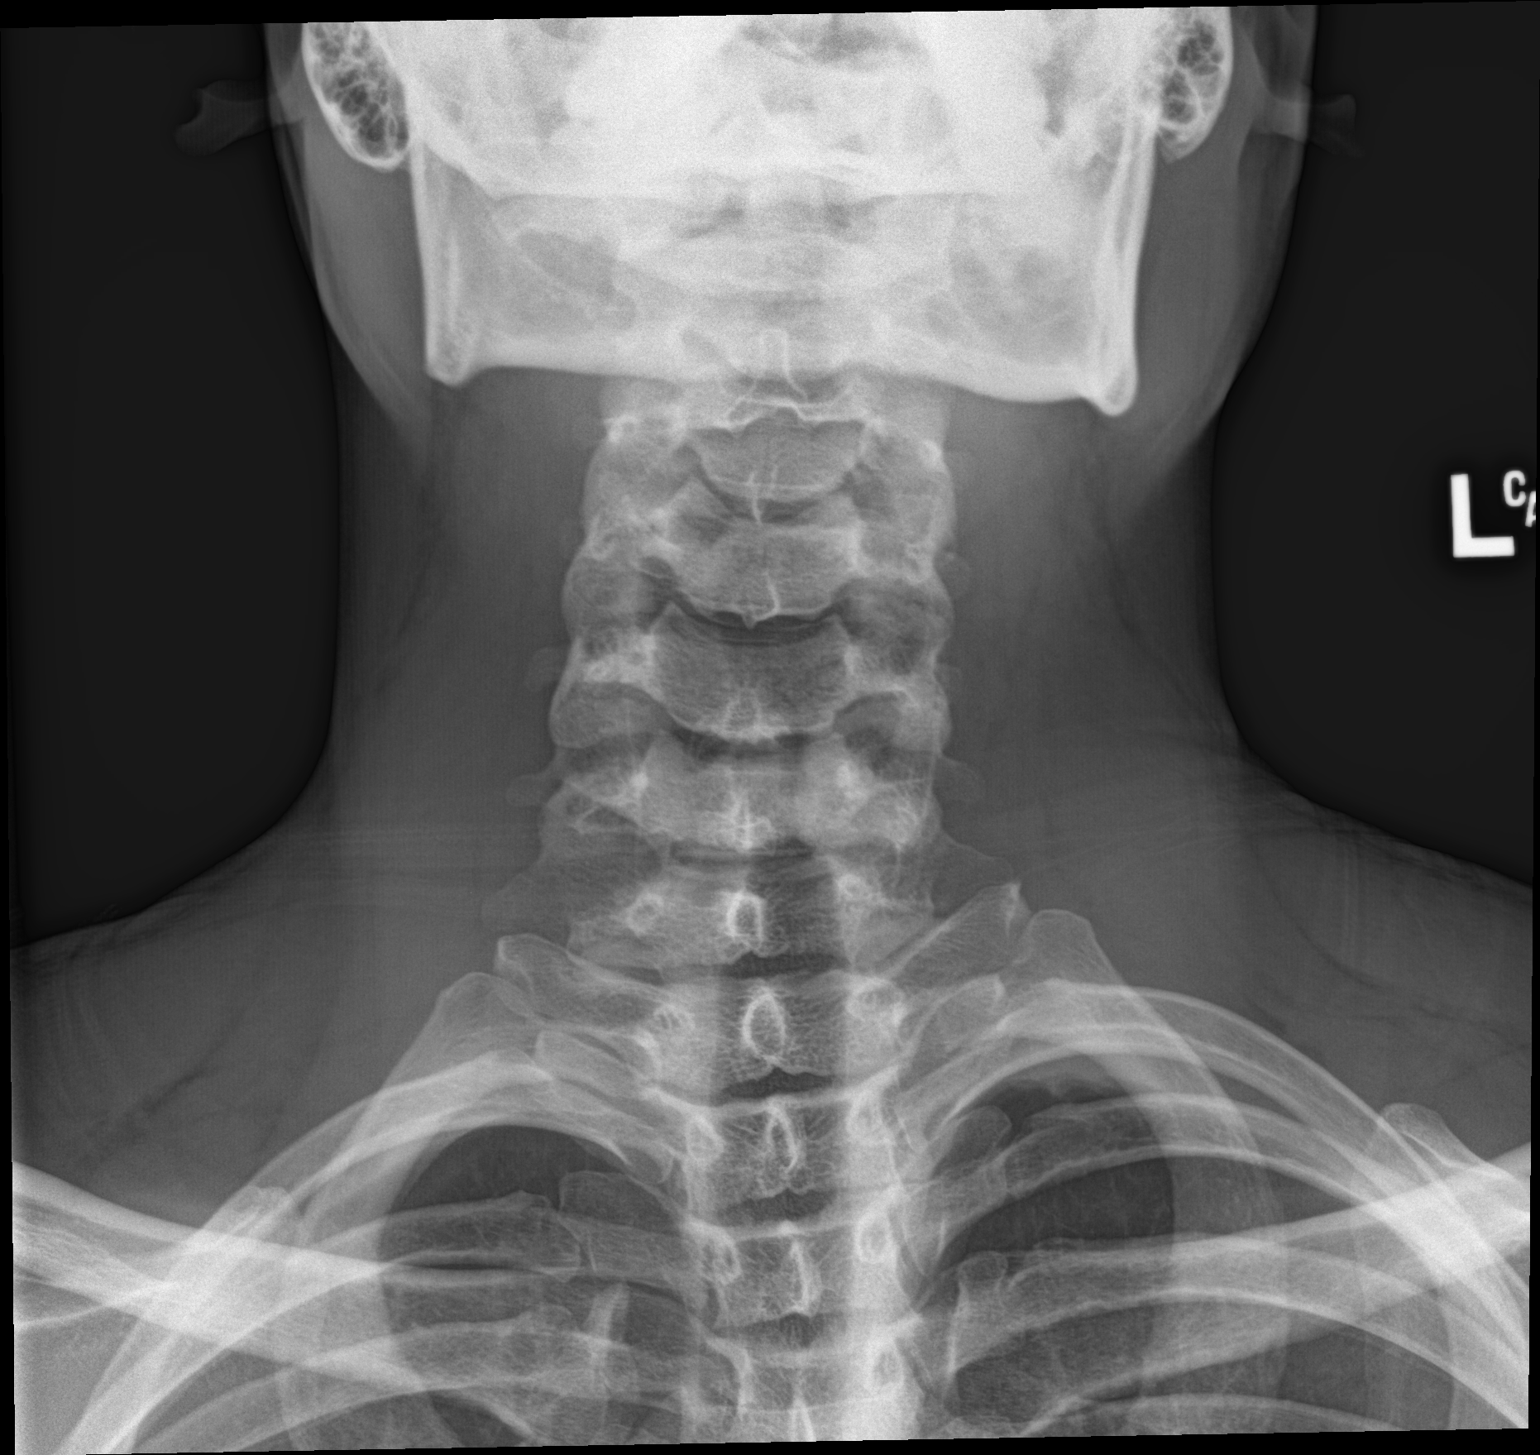

[2 of 2 positions shown; findings below may reference images not displayed]

FINDINGS: There is no evidence of retropharyngeal soft tissue swelling or
epiglottic enlargement. The cervical airway is unremarkable and no
radio-opaque foreign body identified.
IMPRESSION: Negative.
